# Patient Record
Sex: Female | Born: 1960 | Race: White | Hispanic: No | Smoking: Never smoker
Health system: Southern US, Community
[De-identification: ages and names within clinical notes are randomized; demographics above are authoritative.]

## PROBLEM LIST (undated history)

## (undated) DIAGNOSIS — I429 Cardiomyopathy, unspecified: Secondary | ICD-10-CM

## (undated) DIAGNOSIS — G43909 Migraine, unspecified, not intractable, without status migrainosus: Secondary | ICD-10-CM

## (undated) DIAGNOSIS — I421 Obstructive hypertrophic cardiomyopathy: Secondary | ICD-10-CM

## (undated) DIAGNOSIS — R42 Dizziness and giddiness: Secondary | ICD-10-CM

## (undated) HISTORY — DX: Cardiomyopathy, unspecified: I42.9

## (undated) HISTORY — DX: Migraine, unspecified, not intractable, without status migrainosus: G43.909

## (undated) HISTORY — DX: Obstructive hypertrophic cardiomyopathy: I42.1

## (undated) HISTORY — PX: APPENDECTOMY: SHX54

---

## 2014-08-03 ENCOUNTER — Ambulatory Visit: Payer: Self-pay

## 2014-08-03 ENCOUNTER — Ambulatory Visit (INDEPENDENT_AMBULATORY_CARE_PROVIDER_SITE_OTHER): Payer: Self-pay | Admitting: Family Medicine

## 2014-08-03 VITALS — BP 150/100 | HR 78 | Temp 98.2°F | Resp 16 | Ht 61.0 in | Wt 125.0 lb

## 2014-08-03 DIAGNOSIS — H109 Unspecified conjunctivitis: Secondary | ICD-10-CM

## 2014-08-03 DIAGNOSIS — L989 Disorder of the skin and subcutaneous tissue, unspecified: Secondary | ICD-10-CM

## 2014-08-03 DIAGNOSIS — K589 Irritable bowel syndrome without diarrhea: Secondary | ICD-10-CM

## 2014-08-03 DIAGNOSIS — I429 Cardiomyopathy, unspecified: Secondary | ICD-10-CM

## 2014-08-03 DIAGNOSIS — H00016 Hordeolum externum left eye, unspecified eyelid: Secondary | ICD-10-CM

## 2014-08-03 MED ORDER — TOBRAMYCIN 0.3 % OP SOLN: 1.0000 [drp] | Freq: Four times a day (QID) | OPHTHALMIC | Status: DC

## 2014-08-03 NOTE — Progress Notes (Signed)
This a 54 year old woman who several months ago moved from [place] to works for [employer]. She's changed over her insurance this is been a little bit confusing.  She's also had to change her for driver's license and was found to need corrective lenses. She developed a stye in the left eye which is persisted for the last couple months she's had intermittent crusting of her eyelids, most recently starting today.  In the past she's been treated with Tobrex which worked very well. She also [facility] which [facility]'t and is well with persistent film on her eyes.  She woke up this morning with crusting eyelids.  Patient also needs referral to gastroenterology (has had IBS and recent C. difficile), cardiology (patient has had a cardiomyopathy), dermatology (has had skin lesions that need checking), and ophthalmology (needs to have a hordeolum examined).  Objective: Spent about 20 minutes with the patient reviewing her many problems.  Eyes reveal small hordeolum in the mid upper left lid and mild conjunctivitis with lid margin crusting. EOM is normal. Pupils equal and reactive to light.       ICD-9-CM ICD-10-CM   1. Bilateral conjunctivitis 372.30 H10.9 tobramycin (TOBREX) 0.3 % ophthalmic solution     Ambulatory referral to Ophthalmology  2. 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 Skin lesion 709.9 L98.9 Ambulatory referral to Dermatology  5. IBS (irritable bowel syndrome) 564.1 K58.9 Ambulatory referral to Gastroenterology     Signed, Elvina SidleKurt Marisel Tostenson, MD

## 2014-08-03 NOTE — Patient Instructions (Signed)

## 2014-08-31 ENCOUNTER — Ambulatory Visit: Payer: Self-pay | Admitting: Family Medicine

## 2014-10-06 ENCOUNTER — Telehealth: Payer: Self-pay | Admitting: Family Medicine

## 2014-10-06 DIAGNOSIS — H00019 Hordeolum externum unspecified eye, unspecified eyelid: Secondary | ICD-10-CM

## 2014-10-06 DIAGNOSIS — I429 Cardiomyopathy, unspecified: Secondary | ICD-10-CM

## 2014-10-06 NOTE — Telephone Encounter (Signed)
Patient called and said "I need Dr. Milus GlazierLauenstein to give me a call. [phone]". And hung up..... Dr. Elbert EwingsL please handle this.  [phone]

## 2014-10-06 NOTE — Telephone Encounter (Signed)
Spoke with Erin Berry who states she is very unhappy with our referrals department.   1. She was referred to Dr Dione BoozeGroat who she says had a 2 hour wait time on her first visit, then she was scheduled for surgery today 10/06/14 and was told yet again there was a 2 hour wait time. She states she wants to go to another ophthalmologist who is "younger and whose hands smell like they have been washed".  2. Secondly, she complained about her not being scheduled with cardiologist yet, records show that the cardiologist attempted to contact patient on 09/01/14 and left a message for scheduling.   Patient was very upset and rude. She says if Dr L does not handle this she will find another PCP.

## 2014-10-10 NOTE — Telephone Encounter (Signed)
Please advise 

## 2014-10-10 NOTE — Telephone Encounter (Signed)
This is second referral to Dr. Hazle Quantdigby

## 2014-10-10 NOTE — Addendum Note (Signed)
Addended by: Elvina SidleLAUENSTEIN, Ruble Buttler on: 10/10/2014 05:03 PM   Modules accepted: Orders

## 2014-10-13 ENCOUNTER — Telehealth: Payer: Self-pay | Admitting: Cardiovascular Disease

## 2014-10-13 NOTE — Telephone Encounter (Signed)
Faxed release signed by patient to [facility] [phone] Lyman Bishop([place], [place]) for records for appointment with Dr Royann Shiversroitoru on 12/01/14. lp

## 2014-10-13 NOTE — Telephone Encounter (Signed)
Received records from [facility] for appointment with Dr Royann Shiversroitoru on 12/01/14.  [facility] for Dr Croitoru's schedule on 12/01/14. lp

## 2014-11-18 ENCOUNTER — Ambulatory Visit: Payer: Self-pay | Admitting: Cardiovascular Disease

## 2014-12-01 ENCOUNTER — Encounter: Payer: Self-pay | Admitting: Cardiovascular Disease

## 2014-12-01 ENCOUNTER — Ambulatory Visit (INDEPENDENT_AMBULATORY_CARE_PROVIDER_SITE_OTHER): Payer: No Typology Code available for payment source | Admitting: Cardiovascular Disease

## 2014-12-01 VITALS — BP 136/82 | HR 68 | Ht 62.0 in | Wt 128.5 lb

## 2014-12-01 DIAGNOSIS — I421 Obstructive hypertrophic cardiomyopathy: Secondary | ICD-10-CM | POA: Diagnosis not present

## 2014-12-01 DIAGNOSIS — I422 Other hypertrophic cardiomyopathy: Secondary | ICD-10-CM

## 2014-12-01 MED ORDER — FUROSEMIDE 20 MG PO TABS: 20.0000 mg | ORAL_TABLET | ORAL | Status: DC | PRN

## 2014-12-01 NOTE — Progress Notes (Signed)
Patient ID: Lorelee CoverCaroline Amason, female   DOB: 22-Oct-1960, 54 y.o.   MRN: [id number]     Cardiology Office Note   Date:  12/02/2014   ID:  Lorelee CoverCaroline Betke, DOB 22-Oct-1960, MRN [id number]  PCP:  Elvina SidleLAUENSTEIN,KURT, MD  Cardiologist:   Thurmon FairROITORU,Laetitia Schnepf, MD   Chief Complaint  Patient presents with  . New Evaluation    [place]. patient reports swelling-at 3 p she develops a crease in her ankles/feet, shortness of breath on exertion, soreness in her shins, lightheadedness/dizziness-when she stands too quickly.      History of Present Illness: Lorelee CoverCaroline Dimauro is a 54 y.o. female who presents for establishment of cardiology follow-up for hypertrophic obstructive cardiomyopathy after moving here from the [place] area (Dr. Earlie Ravelinghaganti). She had relatively late onset of symptoms around the age of 54. She developed chest pressure or shortness of breath at rest and with minimal exertion that improved drastically after beta blocker therapy was initiated.   Her echocardiogram from July 2014 shows a 71 mm peak gradient across the left ventricular outflow tract with asymmetric septal hypertrophy (septum 1.6 cm, posterior wall 1.2 cm) and borderline enlargement of the left atrium without any other meaningful cardiac abnormalities. Severe SAM was described, but there was only mild mitral insufficiency. She wore a Holter monitor that did not show any significant arrhythmia other than to PACs and whole 24-hour period. She does not have any PVCs or ventricular tachycardia. She underwent a cardiac MRI in August 2014 that showed findings compatible with hypertrophic cardiomyopathy. The septum was again described as moderately thickened at 15-17 millimeters. The ejection fraction was calculated at 59%. Platelet gadolinium diffuse enhancement with a intermediate pattern was seen throughout the left ventricle, consistent with hypertrophic cardiomyopathy.  She has never experienced syncope. There  is no family history of unexplained sudden or premature death, but the patient is adopted and family history is not known. Her daughter has been screened for hypertrophic cardiomyopathy and reportedly does not have the disorder.   She complains of constant fatigue since initiation of beta blocker therapy. She thinks she has had her thyroid checked and that was normal. He falls asleep very easily. She does not think that she snores. She worries about the fact that she has gained weight. She has never had a sleep study. He does score 14 points on the Epworth Sleepiness Scale. She complains of ankle edema which is sometimes so severe that she cannot put her shoes on. At times she has thought that she'll have to buy slippers to walk-in especially after long plane trip. He has been warned by her cardiologist in [place] that she should not take diuretics.  Her echocardiogram shows typical left atrial abnormality and left ventricular hypertrophy with prominent secondary changes in the repolarization.   Past Medical History  Diagnosis Date  . Cardiomyopathy   . Hypertrophic obstructive cardiomyopathy (HOCM) 12/02/2014    Past Surgical History  Procedure Laterality Date  . Appendectomy       Current Outpatient Prescriptions  Medication Sig Dispense Refill  . metoprolol tartrate (LOPRESSOR) 25 MG tablet Take 50 mg by mouth 3 (three) times daily.    . furosemide (LASIX) 20 MG tablet Take 1 tablet (20 mg total) by mouth as needed for fluid or edema (only). **Do not take more than 3 times weekly** 30 tablet 3   No current facility-administered medications for this visit.    Allergies:   Wellbutrin; Sulfur; and Penicillins    Social History:  The patient  reports that she has never smoked. She does not have any smokeless tobacco history on file.   Family History:  The patient's family history is not on file. She was adopted.    ROS:  Please see the history of present illness.    Otherwise,  review of systems positive for none.   All other systems are reviewed and negative.    PHYSICAL EXAM: VS:  BP 136/82 mmHg  Pulse 68  Ht  (1.575 m)  Wt 128 lb 8 oz (58.287 kg)  BMI 23.50 kg/m2 , BMI Body mass index is 23.5 kg/(m^2).  General: Alert, oriented x3, no distress Head: no evidence of trauma, PERRL, EOMI, no exophtalmos or lid lag, no myxedema, no xanthelasma; normal ears, nose and oropharynx Neck: normal jugular venous pulsations and no hepatojugular reflux; brisk carotid pulses without delay and no carotid bruits Chest: clear to auscultation, no signs of consolidation by percussion or palpation, normal fremitus, symmetrical and full respiratory excursions Cardiovascular: normal position and quality of the apical impulse, regular rhythm, normal first and second heart sounds, no diastolic murmurs, rubs or gallops. She has a typical midsystolic murmur in the aortic focus and at the left lower sternal border that increases with the Valsalva maneuver and decreases but is not abolished with bilateral hand grip Abdomen: no tenderness or distention, no masses by palpation, no abnormal pulsatility or arterial bruits, normal bowel sounds, no hepatosplenomegaly Extremities: no clubbing, cyanosi; 1-2+ symmetrical ankle edema; 2+ radial, ulnar and brachial pulses bilaterally; 2+ right femoral, posterior tibial and dorsalis pedis pulses; 2+ left femoral, posterior tibial and dorsalis pedis pulses; no subclavian or femoral bruits Neurological: grossly nonfocal Psych: euthymic mood, full affect   EKG:  EKG is ordered today. The ekg ordered today demonstrates NSR, LAA, LVH  Recent Labs: No results found for requested labs within last 365 days.    Lipid Panel No results found for: CHOL, TRIG, HDL, CHOLHDL, VLDL, LDLCALC, LDLDIRECT    Wt Readings from Last 3 Encounters:  12/01/14 128 lb 8 oz (58.287 kg)  08/03/14 125 lb (56.7 kg)      Other studies Reviewed: Additional studies/  records that were reviewed today include: Records from Dr. Earlie Raveling, Lyman [place], [place]   ASSESSMENT AND PLAN:  Mrs. Kendrick has hypertrophic obstructive cardiomyopathy that has had good symptomatic response to beta blocker therapy but is now troubled by persistent fatigue that could well be a beta blocker side effect. She does not have high risk factors for sudden cardiac death, although her family history is not known. She does not have excessive hypertrophy, personal history of syncope, any documentation of ventricular arrhythmia, etc. She also had very late-onset of the disorder, likely associated with good prognosis.  Recently she has been troubled by a lot of edema. She has some symptoms suggestive of possible obstructive sleep apnea. Will repeat an echocardiogram to assess the current LVOT gradient with and without provocative maneuvers and also to look for signs of right heart enlargement, pulmonary hypertension or other clues for obstructive sleep apnea. I suspect her fatigue is primarily related to beta blocker therapy.  We discussed the option for a mechanical intervention such as septal myectomy or alcohol septal ablation which will allow a reduction dose of beta blocker and possibly better quality of life.  While I reinforced the need to avoid dehydration which could definitely increased LV outflow tract gradient, I think judicious use of a low dose of a loop diuretic on an intermittent  basis may be useful for relief of lower extremity edema, and should be reasonably safe if used in moderation.  We need to consider a sleep study.  Current medicines are reviewed at length with the patient today.  The patient does not have concerns regarding medicines.  The following changes have been made:  Ursula 20 mg daily no more than 3 times weekly.  Labs/ tests ordered today include:  Orders Placed This Encounter  Procedures  . EKG 12-Lead  . Echocardiogram    Patient Instructions  Your  physician has requested that you have an echocardiogram. Echocardiography is a painless test that uses sound waves to create images of your heart. It provides your doctor with information about the size and shape of your heart and how well your heart's chambers and valves are working. This procedure takes approximately one hour. There are no restrictions for this procedure.  Dr. Royann Shiversroitoru recommends that you schedule a follow-up appointment in: 3 months.    Joie BimlerSigned, Quenna Doepke, MD  12/02/2014 4:00 PM    Thurmon FairMihai Ciarrah Rae, MD, [facility] [phone] office [phone] pager

## 2014-12-01 NOTE — Patient Instructions (Addendum)
Your physician has requested that you have an echocardiogram. Echocardiography is a painless test that uses sound waves to create images of your heart. It provides your doctor with information about the size and shape of your heart and how well your heart's chambers and valves are working. This procedure takes approximately one hour. There are no restrictions for this procedure.  Dr. Croitoru recommends that you schedule a follow-up appointment in: 3 months.   

## 2014-12-02 ENCOUNTER — Encounter: Payer: Self-pay | Admitting: Cardiovascular Disease

## 2014-12-02 DIAGNOSIS — I421 Obstructive hypertrophic cardiomyopathy: Secondary | ICD-10-CM

## 2014-12-02 HISTORY — DX: Obstructive hypertrophic cardiomyopathy: I42.1

## 2014-12-06 ENCOUNTER — Ambulatory Visit (HOSPITAL_COMMUNITY)
Admission: RE | Admit: 2014-12-06 | Discharge: 2014-12-06 | Disposition: A | Discharge status: Home / Self Care | Payer: No Typology Code available for payment source | Source: Ambulatory Visit | Attending: Cardiovascular Disease | Admitting: Cardiovascular Disease

## 2014-12-06 DIAGNOSIS — I422 Other hypertrophic cardiomyopathy: Secondary | ICD-10-CM | POA: Insufficient documentation

## 2014-12-09 ENCOUNTER — Telehealth: Payer: Self-pay | Admitting: Cardiovascular Disease

## 2014-12-09 NOTE — Telephone Encounter (Signed)
Pt waiting to hear back about the doctor who is going to do her procedure.She also have questions about dosage of her medicine.

## 2014-12-09 NOTE — Telephone Encounter (Signed)
I spoke with the physician at [facility] Doctor Arty BaumgartnerLowell Satler, who requested I upload her echo images, which has already been done. Please send copies of all the other records there also.

## 2014-12-09 NOTE — Telephone Encounter (Signed)
Returning your call. °

## 2014-12-09 NOTE — Telephone Encounter (Signed)
Spoke with Britta MccreedyBarbara - she also talked w/ patient, confirmed/reiterated correct dosing. Pt verbalized understanding w/instructions.

## 2014-12-09 NOTE — Telephone Encounter (Signed)
[facility] with [facility]s but refer [facility].  She also called [facility] and they are will to treat once they received all of her records.  Requested we fax all [facility] fax #[phone] Attn: Simon RheinCecily.

## 2014-12-09 NOTE — Telephone Encounter (Signed)
Called patient back, left voice mail instructing to call back. Note Dr. Erin Hearingroitoru's comment from result:  ----------------------------------------------------------------------------- Notes Recorded by Thurmon FairMihai Croitoru, MD on 12/07/2014 at 9:50 AM Echo confirms HOCM and still has substantial provocable gradient. I think she might do best with an alcohol septal ablation, which we do not offer here. Called her and suggested [facility], but since she recently moved here from [place] and still has family there, would prefer [facility]. Trying to contact Dr. Rockwell AlexandriaSatler there

## 2014-12-09 NOTE — Telephone Encounter (Signed)
Called this patient. Told her I was triage RN, Harrold Donathathan, calling on behalf of Dr. C to return her call, answer any questions. She was extremely pleasant on the phone at first. She understands we are waiting to hear from [facility].  We were discussing her metoprolol dosing. She asked what it said was prescribed for her to take. I reported that per last office note, she was instructed to continue her historical dose, 50mg  TID.  She insisted that Dr. Royann Shiversroitoru had adjusted the medication for her to take 1000mg  a day. I stated this was not correct, unsure where she got this information. After patient repeatedly asked me to tell her the dose, I did so every time.  She finally insisted that it wasn't correct, that she was told to take 1000mg  a day. She apparently attempted to do so and after taking 600mg  in one day, "felt extremely ill". I stated this was dangerous and inadvisable, and again, was unsure where the instruction was coming from. She got her AVS sheet, began reading over instructions. She acknowledged the dosing instructions for metoprolol as 50mg  TID. She did not find any changes on sheet - became hostile at this point. I stated again, as I had multiple times, that she should adhere to the 50mg  TID dose.  After this exchange, she asked me who I was. I reminded her I was an Nurse, learning disabilityN calling. She became extremely irate with me. Insisted that I didn't know what I was talking about. Demanded to speak to Dr. Royann Shiversroitoru. I informed her that he was not in the office today. Patient began yelling, stated "You will get him, call him and put him on the phone now." Informed her that her behavior was impolite and that I was hanging up the phone. I ended the call.

## 2014-12-09 NOTE — Telephone Encounter (Signed)
You are correct, Harrold Donathathan. I never gave her instructions to increase the metoprolol to 1000 mg a day. In fact our discussion was to continue the same dose of metoprolol she is currently on, because she complains of too much fatigue to increase the dose further.

## 2014-12-09 NOTE — Telephone Encounter (Signed)
Request sent to Lorita OfficerLynn Pruett in medical records F# [phone]  ATTN:   Simon RheinCecily

## 2014-12-12 ENCOUNTER — Telehealth: Payer: Self-pay | Admitting: Cardiovascular Disease

## 2014-12-12 NOTE — Telephone Encounter (Signed)
[facility] to [facility]

## 2014-12-12 NOTE — Telephone Encounter (Signed)
She wants to know if her records have been faxed to Dr Arty BaumgartnerLowell Satler at [facility]?

## 2014-12-13 NOTE — Telephone Encounter (Signed)
5.23.16 Spoke with Ms Erin Berry and advised we could send Dr Croitoru's office note/ekg to the Dr he referred her to in [place].  Will Fax

## 2014-12-14 ENCOUNTER — Telehealth: Payer: Self-pay | Admitting: *Deleted

## 2014-12-14 NOTE — Telephone Encounter (Signed)
-----   Message from Thurmon FairMihai Croitoru, MD sent at 12/14/2014  1:46 PM EDT ----- Please let her know that Dr. Arty BaumgartnerLowell Satler in [place] thinks she could be a good candidate for alcohol septal ablation. He is asking if it is OK for him to call and schedule her. If she agrees, please send him her demographic info.  [email or web address]

## 2014-12-14 NOTE — Telephone Encounter (Signed)
Dr. Herma MeringSatler's office has already contacted Mrs. Sonnenberg.  She will have a conference call with Dr. Rockwell AlexandriaSatler tomorrow 8PM and tenatively scheduled for the procedure 12/29/14 as long as they receive approval from her insurance company.

## 2014-12-14 NOTE — Telephone Encounter (Signed)
-----   Message from Mihai Croitoru, MD sent at 12/14/2014  1:46 PM EDT ----- Please let her know that Dr. Lowell Satler in [place], [place] thinks she could be a good candidate for alcohol septal ablation. He is asking if it is OK for him to call and schedule her. If she agrees, please send him her demographic info.  [email or web address] 

## 2014-12-26 ENCOUNTER — Telehealth: Payer: Self-pay | Admitting: Cardiovascular Disease

## 2014-12-26 MED ORDER — METOPROLOL TARTRATE 25 MG PO TABS: 50.0000 mg | ORAL_TABLET | Freq: Three times a day (TID) | ORAL | Status: DC

## 2014-12-26 NOTE — Telephone Encounter (Signed)
Rx(s) sent to pharmacy electronically. LM for patient that med was refilled 

## 2014-12-26 NOTE — Telephone Encounter (Signed)
°  1. Which medications need to be refilled? Metoprolol 25 mg 2 tab po qd tid  2. Which pharmacy is medication to be sent to?[facility] [address]   3. Do they need a 30 day or 90 day supply? 30  4. Would they like a call back once the medication has been sent to the pharmacy? yes

## 2015-01-13 ENCOUNTER — Encounter: Payer: Self-pay | Admitting: Cardiovascular Disease

## 2015-01-13 ENCOUNTER — Ambulatory Visit (INDEPENDENT_AMBULATORY_CARE_PROVIDER_SITE_OTHER): Payer: No Typology Code available for payment source | Admitting: Cardiovascular Disease

## 2015-01-13 VITALS — BP 122/86 | HR 80 | Ht 62.0 in | Wt 124.2 lb

## 2015-01-13 DIAGNOSIS — I421 Obstructive hypertrophic cardiomyopathy: Secondary | ICD-10-CM

## 2015-01-13 MED ORDER — VERAPAMIL HCL 40 MG PO TABS: 40.0000 mg | ORAL_TABLET | Freq: Three times a day (TID) | ORAL | Status: DC

## 2015-01-13 NOTE — Patient Instructions (Signed)
Medication Instructions:   START verapamil 40mg  by mouth - 1 tablet every 8 hours or 3x daily  Labwork:  NONE  Testing/Procedures:  NONE  Follow-Up:  1st available appointment with Dr Royann Shivers  Any Other Special Instructions Will Be Listed Below (If Applicable).

## 2015-01-13 NOTE — Progress Notes (Signed)
Patient ID: Erin Berry, female   DOB: 08-Nov-1960, 54 y.o.   MRN: [id number]      Cardiology Office Note   Date:  01/13/2015   ID:  Thayer Kimmins, DOB Jun 01, 1961, MRN [id number]  PCP:  Elvina Sidle, MD  Cardiologist:   Thurmon Fair, MD   Chief Complaint  Patient presents with  . Follow-up    had a procedure attempeted not performed alcohol septal ablasion; chest pain-has pressure, pain and tightness, frequently shortness of breath, has edema-fingers, ankles, midsection, has pain in legs, has cramping in legs, has lightheadedness,  has dizziness      History of Present Illness: Erin Berry is a 54 y.o. female who presents for  Follow-up after undergoing coronary angiography at [facility] with Dr. Rockwell Alexandria. 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 At cardiac catheterization, just as on echocardiography, she has high LV outflow tract gradients ( resting 60 mmHg, inducible 100 mmHg) despite being on a relatively high dose of beta blocker. She did not tolerate higher doses of metoprolol due to extreme fatigue.   She is back to discuss other options including septal myectomy or adjunctive pharmacological therapy. Dr. Gershon Mussel showed the available data to surgeons in [place] who declined to perform a septal myectomy. My understanding is that this was primarily related to low volume of this procedure at that center. The patient is hoping to secure an appointment at the [facility] to discuss surgical options in the middle of July.    She continues to describe significant symptoms. She becomes extremely short of breath climbing the steps to her condominium. She has actually paid for and will move into a single-story home. She asks pertinent questions regarding the purpose of pacemakers and defibrillators, her risk of sudden  death , her ability to exercise and overall prognosis.    Past Medical History  Diagnosis Date  . Cardiomyopathy   . Hypertrophic obstructive cardiomyopathy (HOCM) 12/02/2014    Past Surgical History  Procedure Laterality Date  . Appendectomy       Current Outpatient Prescriptions  Medication Sig Dispense Refill  . furosemide (LASIX) 20 MG tablet Take 1 tablet (20 mg total) by mouth as needed for fluid or edema (only). **Do not take more than 3 times weekly** 30 tablet 3  . metoprolol tartrate (LOPRESSOR) 25 MG tablet Take 2 tablets (50 mg total) by mouth 3 (three) times daily. 180 tablet 6  . verapamil (CALAN) 40 MG tablet Take 1 tablet (40 mg total) by mouth 3 (three) times daily. 90 tablet 6   No current facility-administered medications for this visit.    Allergies:   Wellbutrin; Morphine and related; Sulfur; and Penicillins    Social History:  The patient  reports that she has never smoked. She does not have any smokeless tobacco history on file.   Family History:  The patient's family history is not on file. She was adopted.    ROS:  Please see the history of present illness.    Otherwise, review of systems positive for none.   Specifically denies palpitations , syncope and exertional chest pain. She does have fullness in her chest if she climbs too many stairs and describes significant exertional dyspnea, NYHA functional class II. Ankle edema is helped by very low doses of loop diuretic dosed intermittently and compression stockings. All other systems are reviewed and  negative.    PHYSICAL EXAM: VS:  BP 122/86 mmHg  Pulse 80  Ht  (1.575 m)  Wt 124 lb 3.2 oz (56.337 kg)  BMI 22.71 kg/m2 , BMI Body mass index is 22.71 kg/(m^2).  General: Alert, oriented x3, no distress Head: no evidence of trauma, PERRL, EOMI, no exophtalmos or lid lag, no myxedema, no xanthelasma; normal ears, nose and oropharynx Neck: normal jugular venous pulsations and no hepatojugular  reflux; brisk carotid pulses without delay and no carotid bruits Chest: clear to auscultation, no signs of consolidation by percussion or palpation, normal fremitus, symmetrical and full respiratory excursions Cardiovascular: normal position and quality of the apical impulse, regular rhythm, normal first and second heart sounds, no diastolic murmurs, rubs or gallops. She has a typical midsystolic murmur in the aortic focus and at the left lower sternal border that increases with the Valsalva maneuver and decreases but is not abolished with bilateral hand grip Abdomen: no tenderness or distention, no masses by palpation, no abnormal pulsatility or arterial bruits, normal bowel sounds, no hepatosplenomegaly Extremities: no clubbing, cyanosi; 1-2+ symmetrical ankle edema; 2+ radial, ulnar and brachial pulses bilaterally; 2+ right femoral, posterior tibial and dorsalis pedis pulses; 2+ left femoral, posterior tibial and dorsalis pedis pulses; no subclavian or femoral bruits Neurological: grossly nonfocal Psych: euthymic mood, full affect   EKG:  EKG is not ordered today.  Recent Labs: No results found for requested labs within last 365 days.    Lipid Panel No results found for: CHOL, TRIG, HDL, CHOLHDL, VLDL, LDLCALC, LDLDIRECT    Wt Readings from Last 3 Encounters:  01/13/15 124 lb 3.2 oz (56.337 kg)  12/01/14 128 lb 8 oz (58.287 kg)  08/03/14 125 lb (56.7 kg)      Other studies Reviewed: Additional studies/ records that were reviewed today include:  Coronary angiography images and records of brief hospitalization in [place].  ASSESSMENT AND PLAN:  Despite beta blocker therapy in the highest tolerable dose, the patient continues to experience significant exertional symptoms that have at least moderate impact on her quality of life. She wishes to pursue more aggressive therapy. We'll start verapamil 40 mg 3 times daily. Plan to titrate this up as tolerated, if helpful we'll switch to the  sustained release formulation. Discussed disopyramide as an alternative option of potentially more side effects. She asked about pacemakers and review the fact that these are not indicated for treatment of hypertrophic cardiomyopathy, although they can be programmed as adjunctive therapy, if indicated for other reasons.   Encouraged her to pursue the evaluation at the [facility] for septal myectomy. I have also shown her data to one of our surgeons in town who thinks that septal myectomy is a reasonable option.  It'll be up to Erin Berry to decide whether she wants to travel out of town to receive care in a world class center  With more experienced in her disorder,such as the [facility] or if she prefers to have the support of family closer to home.  All these interventions are to provide better symptom control. As far as her risk for sudden cardiac death, thankfully she does not have any of the features associated with high risk for sudden death ( no syncope, no documented VT, absence of severe septal hypertrophy , family history is unknown since she is adopted). Her overall prognosis is fair in view of the relatively late onset of her symptoms , but I cannot promise would find a good solution for symptom relief.  We described the types of physical exercise that she should avoid in the intensity that she should reach. She should avoid any type of exercise the produces his anus, dyspnea or chest discomfort and should avoid intense bursts of isometric activities such as weight lifting , jogging or high-intensity aerobics.  Walking, very light jogging, swimming, biking are more appropriate with constant attention to avoid excessive exercise , heat, dehydration.   Current medicines are reviewed at length with the patient today.  The patient does not have concerns regarding medicines.  The following changes have been made:   Verapamil 40 mg every 8 hours  Labs/ tests ordered today include:  No orders of  the defined types were placed in this encounter.   Patient Instructions  Medication Instructions:   START verapamil  by mouth - 1 tablet every 8 hours or 3x daily  Labwork:  NONE  Testing/Procedures:  NONE  Follow-Up:  1st available appointment with Dr Royann Shivers  Any Other Special Instructions Will Be Listed Below (If Applicable).      Joie Bimler, MD  01/13/2015 11:23 AM    Thurmon Fair, MD, [facility] [phone] office [phone] pager

## 2015-01-16 ENCOUNTER — Telehealth: Payer: Self-pay | Admitting: Cardiovascular Disease

## 2015-01-16 NOTE — Telephone Encounter (Signed)
Pt called in stating that one of her medications did not get called in to the pharmacy last week but she isnt sure the name of it. Please f/u with the pt  Thanks

## 2015-01-16 NOTE — Telephone Encounter (Signed)
Patient notified Dr. Salena Saner wants to try verapamil first.  If this drug fails then he would try the second drug.  Patient voiced understanding and will call if she has any problems.

## 2015-01-16 NOTE — Telephone Encounter (Signed)
Spoke with patient . She states at the last office visit with Dr Royann Shivers had mention starting 2 new medications,but only one was called in "verapamil" She wanted to make sure if another medication is needed.  will defer to Dr Royann Shivers and contact patient.

## 2015-01-16 NOTE — Telephone Encounter (Signed)
The plan is to try verapamil first. The other drug is an alternative option, if verapamil does not work or is not tolerated.

## 2015-01-18 ENCOUNTER — Encounter: Payer: Self-pay | Admitting: Cardiovascular Disease

## 2015-02-17 ENCOUNTER — Ambulatory Visit (INDEPENDENT_AMBULATORY_CARE_PROVIDER_SITE_OTHER): Payer: No Typology Code available for payment source | Admitting: Family Medicine

## 2015-02-17 ENCOUNTER — Encounter: Payer: Self-pay | Admitting: Family Medicine

## 2015-02-17 VITALS — BP 131/77 | HR 72 | Temp 98.3°F | Resp 16 | Ht 62.0 in | Wt 124.6 lb

## 2015-02-17 DIAGNOSIS — F32A Depression, unspecified: Secondary | ICD-10-CM

## 2015-02-17 DIAGNOSIS — Z23 Encounter for immunization: Secondary | ICD-10-CM | POA: Diagnosis not present

## 2015-02-17 DIAGNOSIS — I421 Obstructive hypertrophic cardiomyopathy: Secondary | ICD-10-CM

## 2015-02-17 DIAGNOSIS — F418 Other specified anxiety disorders: Secondary | ICD-10-CM | POA: Diagnosis not present

## 2015-02-17 DIAGNOSIS — F419 Anxiety disorder, unspecified: Secondary | ICD-10-CM

## 2015-02-17 DIAGNOSIS — F329 Major depressive disorder, single episode, unspecified: Secondary | ICD-10-CM

## 2015-02-17 DIAGNOSIS — F430 Acute stress reaction: Secondary | ICD-10-CM | POA: Diagnosis not present

## 2015-02-17 NOTE — Progress Notes (Signed)
Subjective:    Patient ID: Erin Berry, female    DOB: Apr 12, 1961, 54 y.o.   MRN: [id number]  02/17/2015  Referral and Depression   HPI This 54 y.o. female presents for the following:  1.  HOCM: s/p evaluation by Dr. Royann Shivers; s/p echo and EKG.  Went for follow-up. Concerned with echo results.  Recommended more aggressive steps in treating cardiomyopathy HOCM.  Diagnosed in 2014.  Recommended alcohol septal ablation but no one in [place] performs it.  Only aware of one cardiologist at [facility] that performs it.  Will be out of work for 2-3 weeks for recovery.  Pt desires referral to cardiology in [place] area where family lives; referred to cardiologist in [facility].  Reviewed results of echo; scheduled alcohol septal ablation on 12/29/14.  Family present.  Performed EKG, high contrast echo.  Brought out of anesthesia; cardiologist unable to perform procedure.  Unable to successfully complete procedure.  Shocked that [place]/cardiologist was unable to complete procedure.  The artery and vein near septum are not near the septum to help heal the septum.  Dr. Gershon Mussel recommended evaluation at [facility].  Pt has a scheduled trip in upcoming months.  Recommended [facility]. Agreed to see pt on February 08, 2015.  Had three consultations at [facility] on 02/08/15; cardiology, research.  No charge for consultations.  Collected all records to [facility] clinic.  The [facility] physicians recommended septal myomectomy via open heart surgery.  [facility] does not recommend alcohol septal ablation.  Perfect surgical candidate; [facility] performs several per month.  Did receive a gap exception for [facility].  [employer] has not relationship with [facility].  Must look at policy and deductible; will determine need for a gap exception. Will require five day admission.  Every procedure must be submitted to [facility] and be approved with correct billing codes.   [employer] wants the providers want pt to undergo  surgery in [place].  In [place], no CVTS surgeon would accept the case.  Pt desires to go to a large cardiology group who performs surgery daily and not someone in [place] who performs two per year.  Local cardiologist only looked within practice to perform surgery.   [name], MD Chair, Cardiovascular diseases [address] [place], [place] [address] [phone]  Taking Metoprolol 25mg  two tid.  Causes horrible diarrhea.  Must take five imodium every morning. Also taking Verapamil 40mg  tid.     2.  Depression:  Failed depression screening.  Suffers with SOB; unable to tolerate heat due to SOB.  Denies SI/HI.     Last physical: years ago Pap smear:  Years ago; appointment scheduled Mammogram:  2014 Colonoscopy:  2015 TDAP:  Years ago. Pneumovax:  never Influenza:  never Eye exam: Dental exam:  Review of Systems  Past Medical History  Diagnosis Date  . Cardiomyopathy   . Hypertrophic obstructive cardiomyopathy (HOCM) 12/02/2014   Past Surgical History  Procedure Laterality Date  . Appendectomy    . Cesarean section     Allergies  Allergen Reactions  . Wellbutrin [Bupropion] Anaphylaxis  . Morphine And Related Other (See Comments)    Inside and out of body felt like it was on fire  . Sulfur Hives  . Penicillins Hives   Current Outpatient Prescriptions  Medication Sig Dispense Refill  . diphenhydrAMINE (BENADRYL) 25 MG tablet Take 25 mg by mouth every 6 (six) hours as needed.    . furosemide (LASIX) 20 MG tablet  Take 1 tablet (20 mg total) by mouth as needed for fluid or edema (only). **Do not take more than 3 times weekly** 30 tablet 3  . metoprolol tartrate (LOPRESSOR) 25 MG tablet Take 2 tablets (50 mg total) by mouth 3 (three) times daily. 180 tablet 6  . verapamil (CALAN) 40 MG tablet Take 1 tablet (40 mg total) by mouth 3 (three) times daily. 90 tablet 6   No current facility-administered medications for this visit.       Objective:    BP 131/77  mmHg  Pulse 72  Temp(Src) 98.3 F (36.8 C) (Oral)  Resp 16  Ht  (1.575 m)  Wt 124 lb 9.6 oz (56.518 kg)  BMI 22.78 kg/m2  SpO2 95% Physical Exam No results found for this or any previous visit.     Assessment & Plan:   Hypertrophic obstructive cardiomyopathy (HOCM) - Plan: Ambulatory referral to Cardiology, Ambulatory referral to Cardiothoracic Surgery  Anxiety and depression  Acute stress reaction    Meds ordered this encounter  Medications  . diphenhydrAMINE (BENADRYL) 25 MG tablet    Sig: Take 25 mg by mouth every 6 (six) hours as needed.    No Follow-up on file.    Kacee Sukhu Paulita Fujita, M.D. [facility] [address] [place], [place]  [address] [phone] phone [phone] fax

## 2015-03-02 ENCOUNTER — Telehealth: Payer: Self-pay | Admitting: Cardiovascular Disease

## 2015-03-02 NOTE — Telephone Encounter (Signed)
°  Mrs Capurro is calling to ask for a stronger diuretic , the one she is taking now is not strong enough. Please Call

## 2015-03-02 NOTE — Telephone Encounter (Signed)
Called patient LVMTCB.

## 2015-03-02 NOTE — Telephone Encounter (Signed)
Patient is taking 20 mg 2 times a week and she is  Drinking all this fluid and it is not getting out of her body.  She does not weigh herself daily  She knows that she is retaining fluid because by 1pm in the afternoon  Her ring is off and she can't button her pants.    Her ankles becomes very swollen up to her knees.  She has an appointment with Dr. Royann Shivers on [address].  She also wanted me to let him know that she is having a Tax inspector  At [facility] in [place]

## 2015-03-03 NOTE — Telephone Encounter (Signed)
Please tell her she can take 40 mg (220 ) at a time. Try to take no more than 2-3 times a week

## 2015-03-03 NOTE — Telephone Encounter (Signed)
Lm for patient to call back on Monday.

## 2015-03-06 ENCOUNTER — Ambulatory Visit (INDEPENDENT_AMBULATORY_CARE_PROVIDER_SITE_OTHER): Payer: No Typology Code available for payment source | Admitting: Cardiovascular Disease

## 2015-03-06 VITALS — BP 110/70 | HR 70 | Resp 16 | Ht 62.0 in | Wt 125.0 lb

## 2015-03-06 DIAGNOSIS — I421 Obstructive hypertrophic cardiomyopathy: Secondary | ICD-10-CM

## 2015-03-06 DIAGNOSIS — I5033 Acute on chronic diastolic (congestive) heart failure: Secondary | ICD-10-CM

## 2015-03-06 MED ORDER — FUROSEMIDE 20 MG PO TABS: 20.0000 mg | ORAL_TABLET | Freq: Every day | ORAL | Status: DC | PRN

## 2015-03-06 NOTE — Progress Notes (Signed)
Patient ID: Sulay Brymer, female   DOB: [birth year], 54 y.o.   MRN: [id number]     Cardiology Office Note   Date:  03/07/2015   ID:  Zoha Spranger, DOB January 14, 1961, MRN [id number]  PCP:  Elvina Sidle, MD  Cardiologist:   Thurmon Fair, MD   Chief Complaint  Patient presents with  . Follow-up    has had chest pain since the last vist  . Shortness of Breath    since last time  . Edema    feet fingers and mid section      History of Present Illness: Evadna Donaghy is a 54 y.o. female who presents for her sending edema in the setting of hypertrophic obstructive cardiomyopathy. She is scheduled to undergo a septal myectomy on November 30 with Dr. York Spaniel at the [facility]. Exertional dyspnea and dizziness have persisted despite treatment with highest tolerated dose of beta blocker and verapamil. She complains of extreme fatigue from the beta blocker and constant sleepiness. At cardiac catheterization, just as on echocardiography, she has high LV outflow tract gradients ( resting 60 mmHg, inducible 100 mmHg) despite being on a relatively high dose of beta blocker.  Over the last week she has had worsening lower extremity edema and did not seem to have as good of a response from furosemide as in the past. Suddenly over the weekend she had improved diuresis and at the time of appointment today ankle edema has resolved. It was severe enough to be extremely uncomfortable and she trouble getting her shoes on.  She also reports extreme shortness of breath during her flight from [place] to [place] several weeks ago.  She has been drinking 6 bottles of Propel water with electrolytes-it has an extremely high sodium content (230 mg, 10% of daily recommended amount per bottle). She was unaware about the very large amount of sodium that she has been ingesting.  She denies palpitations or near syncope/syncope.   Past Medical History  Diagnosis Date  . Cardiomyopathy   . Hypertrophic  obstructive cardiomyopathy (HOCM) 12/02/2014    Past Surgical History  Procedure Laterality Date  . Appendectomy    . Cesarean section       Current Outpatient Prescriptions  Medication Sig Dispense Refill  . diphenhydrAMINE (BENADRYL) 25 MG tablet Take 25 mg by mouth every 6 (six) hours as needed.    . furosemide (LASIX) 20 MG tablet Take 1 tablet (20 mg total) by mouth daily as needed for fluid or edema (only). 30 tablet 6  . metoprolol tartrate (LOPRESSOR) 25 MG tablet Take 2 tablets (50 mg total) by mouth 3 (three) times daily. 180 tablet 6  . verapamil (CALAN) 40 MG tablet Take 1 tablet (40 mg total) by mouth 3 (three) times daily. 90 tablet 6   No current facility-administered medications for this visit.    Allergies:   Wellbutrin; Morphine and related; Sulfur; and Penicillins    Social History:  The patient  reports that she has never smoked. She does not have any smokeless tobacco history on file.   Family History:  The patient's family history is not known. She was adopted.    ROS:  Please see the history of present illness.    Otherwise, review of systems positive for headaches.   All other systems are reviewed and negative.    PHYSICAL EXAM: VS:  BP 110/70 mmHg  Pulse 70  Ht  (1.575 m)  Wt 125 lb (56.7 kg)  BMI 22.86 kg/m2 , BMI Body mass  index is 22.86 kg/(m^2).  General: Alert, oriented x3, no distress Head: no evidence of trauma, PERRL, EOMI, no exophtalmos or lid lag, no myxedema, no xanthelasma; normal ears, nose and oropharynx Neck: normal jugular venous pulsations and no hepatojugular reflux; brisk carotid pulses without delay and no carotid bruits Chest: clear to auscultation, no signs of consolidation by percussion or palpation, normal fremitus, symmetrical and full respiratory excursions Cardiovascular: normal position and quality of the apical impulse, regular rhythm, normal first and second heart sounds, no rubs or gallops, grade 2/6 harsh  systolic ejection murmur at the left lower sternal border and aortic focus does not radiate to the carotids, worsens following the Valsalva maneuver and diminishes with bilateral handgrip Abdomen: no tenderness or distention, no masses by palpation, no abnormal pulsatility or arterial bruits, normal bowel sounds, no hepatosplenomegaly Extremities: no clubbing, cyanosis or edema; 2+ radial, ulnar and brachial pulses bilaterally; 2+ right femoral, posterior tibial and dorsalis pedis pulses; 2+ left femoral, posterior tibial and dorsalis pedis pulses; no subclavian or femoral bruits Neurological: grossly nonfocal Psych: euthymic mood, full affect   EKG:  EKG is not ordered today.   Wt Readings from Last 3 Encounters:  03/06/15 125 lb (56.7 kg)  02/17/15 124 lb 9.6 oz (56.518 kg)  01/13/15 124 lb 3.2 oz (56.337 kg)     ASSESSMENT AND PLAN:  1. Diastolic heart failure with acute worsening secondary to excessive sodium intake. Stop drinking the sodium enriched water. Continue current diuretic regimen. Avoid excessive diuresis since this may lead to presyncope or syncope.  2. Hypertrophic obstructive cardiomyopathy with persistent gradient despite combination beta blocker and calcium channel blocker treatment. Felt not to be a good candidate for alcohol septal ablation due to coronary anatomy, now scheduled for septal myectomy in November at the [facility] in [place]. It is anticipated she will spent 5 days in [place] in the hospital an additional couple of days in recovery there. Subsequently she plans to go to [place] to live with her family. She may require oxygen supplementation for the return flight to [place].  Current medicines are reviewed at length with the patient today.  The patient does not have concerns regarding medicines.  The following changes have been made:  no change  Labs/ tests ordered today include:  No orders of the defined types were placed in this  encounter.     Patient Instructions  A NEW RX HAS BEEN SENT TO YOUR PHARMACY FOR FUROSEMIDE INSTRUCTING YOU TO TAKE DAILY AS NEEDED, AS INSTRUCTED.  Dr. Royann Shivers recommends that you schedule a follow-up appointment in: EARLY January 2017.    Joie Bimler, MD  03/07/2015 2:05 PM    Thurmon Fair, MD, [facility] [phone] office [phone] pager

## 2015-03-06 NOTE — Patient Instructions (Signed)
A NEW RX HAS BEEN SENT TO YOUR PHARMACY FOR FUROSEMIDE INSTRUCTING YOU TO TAKE DAILY AS NEEDED, AS INSTRUCTED.  Dr. Royann Shivers recommends that you schedule a follow-up appointment in: EARLY January 2017.

## 2015-03-06 NOTE — Telephone Encounter (Signed)
Discussed with patient at today's office visit

## 2015-03-07 ENCOUNTER — Encounter: Payer: Self-pay | Admitting: Cardiovascular Disease

## 2015-03-07 DIAGNOSIS — I5032 Chronic diastolic (congestive) heart failure: Secondary | ICD-10-CM

## 2015-03-07 DIAGNOSIS — I421 Obstructive hypertrophic cardiomyopathy: Secondary | ICD-10-CM | POA: Insufficient documentation

## 2015-03-13 ENCOUNTER — Ambulatory Visit (INDEPENDENT_AMBULATORY_CARE_PROVIDER_SITE_OTHER): Payer: No Typology Code available for payment source | Admitting: Internal Medicine

## 2015-03-13 VITALS — BP 122/80 | HR 73 | Temp 97.3°F | Resp 16 | Ht 61.0 in | Wt 125.8 lb

## 2015-03-13 DIAGNOSIS — H8110 Benign paroxysmal vertigo, unspecified ear: Secondary | ICD-10-CM | POA: Insufficient documentation

## 2015-03-13 DIAGNOSIS — H8111 Benign paroxysmal vertigo, right ear: Secondary | ICD-10-CM

## 2015-03-13 DIAGNOSIS — I421 Obstructive hypertrophic cardiomyopathy: Secondary | ICD-10-CM

## 2015-03-13 MED ORDER — MECLIZINE HCL 25 MG PO TABS: 25.0000 mg | ORAL_TABLET | Freq: Three times a day (TID) | ORAL | Status: DC | PRN

## 2015-03-13 NOTE — Patient Instructions (Signed)
Please take the meclizine up to three times daily as needed.  Be sure to go to the ER asap if the vertigo comes on and does not resolve or you feel weakness on one side of your body.   Epley Maneuver Self-Care WHAT IS THE EPLEY MANEUVER? The Epley maneuver is an exercise you can do to relieve symptoms of benign paroxysmal positional vertigo (BPPV). This condition is often just referred to as vertigo. BPPV is caused by the movement of tiny crystals (canaliths) inside your inner ear. The accumulation and movement of canaliths in your inner ear causes a sudden spinning sensation (vertigo) when you move your head to certain positions. Vertigo usually lasts about 30 seconds. BPPV usually occurs in just one ear. If you get vertigo when you lie on your left side, you probably have BPPV in your left ear. Your health care provider can tell you which ear is involved.  BPPV may be caused by a head injury. Many people older than 50 get BPPV for unknown reasons. If you have been diagnosed with BPPV, your health care provider may teach you how to do this maneuver. BPPV is not life threatening (benign) and usually goes away in time.  WHEN SHOULD I PERFORM THE EPLEY MANEUVER? You can do this maneuver at home whenever you have symptoms of vertigo. You may do the Epley maneuver up to 3 times a day until your symptoms of vertigo go away. HOW SHOULD I DO THE EPLEY MANEUVER? 1. Sit on the edge of a bed or table with your back straight. Your legs should be extended or hanging over the edge of the bed or table.  2. Turn your head halfway toward the affected ear.  3. Lie backward quickly with your head turned until you are lying flat on your back. You may want to position a pillow under your shoulders.  4. Hold this position for 30 seconds. You may experience an attack of vertigo. This is normal. Hold this position until the vertigo stops. 5. Then turn your head to the opposite direction until your unaffected ear is facing  the floor.  6. Hold this position for 30 seconds. You may experience an attack of vertigo. This is normal. Hold this position until the vertigo stops. 7. Now turn your whole body to the same side as your head. Hold for another 30 seconds.  8. You can then sit back up. ARE THERE RISKS TO THIS MANEUVER? In some cases, you may have other symptoms (such as changes in your vision, weakness, or numbness). If you have these symptoms, stop doing the maneuver and call your health care provider. Even if doing these maneuvers relieves your vertigo, you may still have dizziness. Dizziness is the sensation of light-headedness but without the sensation of movement. Even though the Epley maneuver may relieve your vertigo, it is possible that your symptoms will return within 5 years. WHAT SHOULD I DO AFTER THIS MANEUVER? After doing the Epley maneuver, you can return to your normal activities. Ask your doctor if there is anything you should do at home to prevent vertigo. This may include:  Sleeping with two or more pillows to keep your head elevated.  Not sleeping on the side of your affected ear.  Getting up slowly from bed.  Avoiding sudden movements during the day.  Avoiding extreme head movement, like looking up or bending over.  Wearing a cervical collar to prevent sudden head movements. WHAT SHOULD I DO IF MY SYMPTOMS GET WORSE? Call  your health care provider if your vertigo gets worse. Call your provider right way if you have other symptoms, including:   Nausea.  Vomiting.  Headache.  Weakness.  Numbness.  Vision changes. Document Released: 07/13/2013 Document Reviewed: 07/13/2013 [facility] Patient Information 2015 [place], [place]. This information is not intended to replace advice given to you by your health care provider. Make sure you discuss any questions you have with your health care provider.   Benign Positional Vertigo Vertigo means you feel like you or your surroundings are  moving when they are not. Benign positional vertigo is the most common form of vertigo. Benign means that the cause of your condition is not serious. Benign positional vertigo is more common in older adults. CAUSES  Benign positional vertigo is the result of an upset in the labyrinth system. This is an area in the middle ear that helps control your balance. This may be caused by a viral infection, head injury, or repetitive motion. However, often no specific cause is found. SYMPTOMS  Symptoms of benign positional vertigo occur when you move your head or eyes in different directions. Some of the symptoms may include: 9. Loss of balance and falls. 10. Vomiting. 11. Blurred vision. 12. Dizziness. 13. Nausea. 14. Involuntary eye movements (nystagmus). DIAGNOSIS  Benign positional vertigo is usually diagnosed by physical exam. If the specific cause of your benign positional vertigo is unknown, your caregiver may perform imaging tests, such as magnetic resonance imaging (MRI) or computed tomography (CT). TREATMENT  Your caregiver may recommend movements or procedures to correct the benign positional vertigo. Medicines such as meclizine, benzodiazepines, and medicines for nausea may be used to treat your symptoms. In rare cases, if your symptoms are caused by certain conditions that affect the inner ear, you may need surgery. HOME CARE INSTRUCTIONS   Follow your caregiver's instructions.  Move slowly. Do not make sudden body or head movements.  Avoid driving.  Avoid operating heavy machinery.  Avoid performing any tasks that would be dangerous to you or others during a vertigo episode.  Drink enough fluids to keep your urine clear or pale yellow. SEEK IMMEDIATE MEDICAL CARE IF:   You develop problems with walking, weakness, numbness, or using your arms, hands, or legs.  You have difficulty speaking.  You develop severe headaches.  Your nausea or vomiting continues or gets worse.  You  develop visual changes.  Your family or friends notice any behavioral changes.  Your condition gets worse.  You have a fever.  You develop a stiff neck or sensitivity to light. MAKE SURE YOU:   Understand these instructions.  Will watch your condition.  Will get help right away if you are not doing well or get worse. Document Released: 04/15/2006 Document Revised: 09/30/2011 Document Reviewed: 03/28/2011 [facility] Patient Information 2015 [place], [place]. This information is not intended to replace advice given to you by your health care provider. Make sure you discuss any questions you have with your health care provider.

## 2015-03-13 NOTE — Progress Notes (Signed)
   Subjective:    Patient ID: Erin Berry, female    DOB: 1961-05-08, 54 y.o.   MRN: [id number]  Chief Complaint  Patient presents with  . Headache    x 4 days   . balance    pt. concerned    Medications, allergies, past medical history, surgical history, family history, social history and problem list reviewed and updated.  HPI  54 yof presents with HA and balance issues.   Sleeping 4 night ago and awoke with sensation of room spinning. Lasted most of night. Unsteady gait most of next morning. Eventually recovered and next night while lying down with head to right had similar sx. Intermittent 5-6 times throughout the night past 3 nights. Only with head turned to right. Feels her balance has been slightly off each morning but gets better throughout day. Right side of head hurts when she has each vertigo episode. Vision in right eye somewhat blurry when has the episodes.   Bilateral hand tingling past week. Denies unilateral weakness, numbness. Denies double vision.   Has hx HCOM with diastolic hf, will be undergoing septal myectomy later this year.   Review of Systems No fevers, chills, palps, cp, sob.     Objective:   Physical Exam  Constitutional: She is oriented to person, place, and time. She appears well-developed and well-nourished.  Non-toxic appearance. She does not have a sickly appearance. She does not appear ill. No distress.  BP 122/80 mmHg  Pulse 73  Temp(Src) 97.3 F (36.3 C) (Oral)  Resp 16  Ht  (1.549 m)  Wt 125 lb 12.8 oz (57.063 kg)  BMI 23.78 kg/m2  SpO2 98%   HENT:  Right Ear: Tympanic membrane normal.  Left Ear: Tympanic membrane normal.  Eyes: Conjunctivae and EOM are normal. Pupils are equal, round, and reactive to light.  Neck: Normal range of motion. Carotid bruit is not present. No Brudzinski's sign noted.  Neurological: She is oriented to person, place, and time. She has normal strength. No cranial nerve deficit or sensory deficit.    Negative rapid alternating movements. Positive dix halpike testing to right. Negative to left.   Psychiatric: She has a normal mood and affect. Her speech is normal and behavior is normal.      Assessment & Plan:   Benign paroxysmal positional vertigo, right - Plan: meclizine (ANTIVERT) 25 MG tablet  Hypertrophic obstructive cardiomyopathy (HOCM)  BPPV (benign paroxysmal positional vertigo), right --doubt cva/tia as sx are intermittent, positional, and no vascular rfs along with normal neuro exam today --likely bppv with positional sx reproducible with dix halpike --meclizine, positional maneuvers reviewed --rtc if vertigo persists, new onset weakness  Donnajean Lopes, PA-C Physician Assistant-Certified [facility]  03/13/2015 7:00 PM  I have participated in the care of this patient with the Advanced Practice Provider and agree with Diagnosis and Plan as documented. Robert P. Merla Riches, M.D.

## 2015-04-13 ENCOUNTER — Encounter (HOSPITAL_COMMUNITY): Payer: Self-pay | Admitting: *Deleted

## 2015-04-13 ENCOUNTER — Emergency Department (HOSPITAL_COMMUNITY): Payer: No Typology Code available for payment source

## 2015-04-13 ENCOUNTER — Inpatient Hospital Stay (HOSPITAL_COMMUNITY)
Admission: EM | Admit: 2015-04-13 | Discharge: 2015-04-15 | DRG: 421 | Disposition: A | Discharge status: Home / Self Care | Payer: No Typology Code available for payment source | Attending: Family Medicine | Admitting: Family Medicine

## 2015-04-13 ENCOUNTER — Inpatient Hospital Stay (HOSPITAL_COMMUNITY): Payer: No Typology Code available for payment source

## 2015-04-13 DIAGNOSIS — Z79899 Other long term (current) drug therapy: Secondary | ICD-10-CM | POA: Diagnosis not present

## 2015-04-13 DIAGNOSIS — K760 Fatty (change of) liver, not elsewhere classified: Secondary | ICD-10-CM | POA: Diagnosis present

## 2015-04-13 DIAGNOSIS — I5032 Chronic diastolic (congestive) heart failure: Secondary | ICD-10-CM | POA: Diagnosis present

## 2015-04-13 DIAGNOSIS — I5033 Acute on chronic diastolic (congestive) heart failure: Secondary | ICD-10-CM

## 2015-04-13 DIAGNOSIS — Z888 Allergy status to other drugs, medicaments and biological substances status: Secondary | ICD-10-CM

## 2015-04-13 DIAGNOSIS — K831 Obstruction of bile duct: Secondary | ICD-10-CM | POA: Insufficient documentation

## 2015-04-13 DIAGNOSIS — Z882 Allergy status to sulfonamides status: Secondary | ICD-10-CM

## 2015-04-13 DIAGNOSIS — K858 Other acute pancreatitis: Secondary | ICD-10-CM

## 2015-04-13 DIAGNOSIS — E876 Hypokalemia: Secondary | ICD-10-CM | POA: Diagnosis present

## 2015-04-13 DIAGNOSIS — I421 Obstructive hypertrophic cardiomyopathy: Secondary | ICD-10-CM | POA: Diagnosis present

## 2015-04-13 DIAGNOSIS — Z880 Allergy status to penicillin: Secondary | ICD-10-CM

## 2015-04-13 DIAGNOSIS — Z885 Allergy status to narcotic agent status: Secondary | ICD-10-CM | POA: Diagnosis not present

## 2015-04-13 DIAGNOSIS — K851 Biliary acute pancreatitis without necrosis or infection: Secondary | ICD-10-CM

## 2015-04-13 DIAGNOSIS — K859 Acute pancreatitis without necrosis or infection, unspecified: Secondary | ICD-10-CM | POA: Diagnosis present

## 2015-04-13 DIAGNOSIS — K7689 Other specified diseases of liver: Secondary | ICD-10-CM | POA: Diagnosis present

## 2015-04-13 DIAGNOSIS — R1011 Right upper quadrant pain: Secondary | ICD-10-CM

## 2015-04-13 DIAGNOSIS — R109 Unspecified abdominal pain: Secondary | ICD-10-CM | POA: Diagnosis not present

## 2015-04-13 HISTORY — DX: Dizziness and giddiness: R42

## 2015-04-13 LAB — COMPREHENSIVE METABOLIC PANEL
ALT: 74 U/L — AB (ref 14–54)
AST: 159 U/L — AB (ref 15–41)
Albumin: 4.1 g/dL (ref 3.5–5.0)
Alkaline Phosphatase: 186 U/L — ABNORMAL HIGH (ref 38–126)
Anion gap: 16 — ABNORMAL HIGH (ref 5–15)
BILIRUBIN TOTAL: 1.9 mg/dL — AB (ref 0.3–1.2)
BUN: <5 mg/dL — ABNORMAL LOW (ref 6–20)
CALCIUM: 9.2 mg/dL (ref 8.9–10.3)
CO2: 23 mmol/L (ref 22–32)
CREATININE: 0.57 mg/dL (ref 0.44–1.00)
Chloride: 95 mmol/L — ABNORMAL LOW (ref 101–111)
Glucose, Bld: 116 mg/dL — ABNORMAL HIGH (ref 65–99)
Potassium: 3.3 mmol/L — ABNORMAL LOW (ref 3.5–5.1)
Sodium: 134 mmol/L — ABNORMAL LOW (ref 135–145)
TOTAL PROTEIN: 7 g/dL (ref 6.5–8.1)

## 2015-04-13 LAB — CBC WITH DIFFERENTIAL/PLATELET
Basophils Absolute: 0 10*3/uL (ref 0.0–0.1)
Basophils Relative: 0 %
EOS PCT: 0 %
Eosinophils Absolute: 0 10*3/uL (ref 0.0–0.7)
HEMATOCRIT: 44.7 % (ref 36.0–46.0)
HEMOGLOBIN: 15.4 g/dL — AB (ref 12.0–15.0)
LYMPHS ABS: 2.1 10*3/uL (ref 0.7–4.0)
LYMPHS PCT: 23 %
MCH: 38.8 pg — ABNORMAL HIGH (ref 26.0–34.0)
MCHC: 34.5 g/dL (ref 30.0–36.0)
MCV: 112.6 fL — AB (ref 78.0–100.0)
MONOS PCT: 7 %
Monocytes Absolute: 0.6 10*3/uL (ref 0.1–1.0)
NEUTROS ABS: 6.3 10*3/uL (ref 1.7–7.7)
Neutrophils Relative %: 70 %
Platelets: 263 10*3/uL (ref 150–400)
RBC: 3.97 MIL/uL (ref 3.87–5.11)
RDW: 14.9 % (ref 11.5–15.5)
WBC: 9 10*3/uL (ref 4.0–10.5)

## 2015-04-13 LAB — LIPID PANEL
CHOLESTEROL: 218 mg/dL — AB (ref 0–200)
LDL Cholesterol: UNDETERMINED mg/dL (ref 0–99)
Triglycerides: 1254 mg/dL — ABNORMAL HIGH (ref <150)
VLDL: UNDETERMINED mg/dL (ref 0–40)

## 2015-04-13 LAB — BRAIN NATRIURETIC PEPTIDE: B Natriuretic Peptide: 103.7 pg/mL — ABNORMAL HIGH (ref 0.0–100.0)

## 2015-04-13 LAB — I-STAT TROPONIN, ED: Troponin i, poc: 0.01 ng/mL (ref 0.00–0.08)

## 2015-04-13 LAB — LIPASE, BLOOD: LIPASE: 265 U/L — AB (ref 22–51)

## 2015-04-13 LAB — TRIGLYCERIDES: Triglycerides: 175 mg/dL — ABNORMAL HIGH (ref <150)

## 2015-04-13 LAB — LACTATE DEHYDROGENASE: LDH: 327 U/L — AB (ref 98–192)

## 2015-04-13 MED ORDER — ONDANSETRON HCL 4 MG/2ML IJ SOLN
4.0000 mg | Freq: Four times a day (QID) | INTRAMUSCULAR | Status: DC | PRN
  Administered 2015-04-13: 4 mg via INTRAVENOUS
  Filled 2015-04-13: qty 2

## 2015-04-13 MED ORDER — IOHEXOL 300 MG/ML  SOLN
100.0000 mL | Freq: Once | INTRAMUSCULAR | Status: AC | PRN
  Administered 2015-04-13: 100 mL via INTRAVENOUS

## 2015-04-13 MED ORDER — INSULIN REGULAR HUMAN 100 UNIT/ML IJ SOLN
INTRAMUSCULAR | Status: DC
  Filled 2015-04-13: qty 2.5

## 2015-04-13 MED ORDER — ONDANSETRON HCL 4 MG/2ML IJ SOLN
4.0000 mg | Freq: Once | INTRAMUSCULAR | Status: AC
  Administered 2015-04-13: 4 mg via INTRAVENOUS
  Filled 2015-04-13: qty 2

## 2015-04-13 MED ORDER — DIPHENHYDRAMINE HCL 25 MG PO CAPS
25.0000 mg | ORAL_CAPSULE | Freq: Once | ORAL | Status: DC
  Filled 2015-04-13: qty 1

## 2015-04-13 MED ORDER — HYDROMORPHONE HCL 1 MG/ML IJ SOLN
1.0000 mg | Freq: Once | INTRAMUSCULAR | Status: AC
  Administered 2015-04-13: 1 mg via INTRAVENOUS
  Filled 2015-04-13: qty 1

## 2015-04-13 MED ORDER — SODIUM CHLORIDE 0.9 % IV SOLN
INTRAVENOUS | Status: DC
  Administered 2015-04-13: 16:00:00 via INTRAVENOUS

## 2015-04-13 MED ORDER — POTASSIUM CHLORIDE CRYS ER 20 MEQ PO TBCR
40.0000 meq | EXTENDED_RELEASE_TABLET | Freq: Once | ORAL | Status: AC
  Administered 2015-04-13: 40 meq via ORAL
  Filled 2015-04-13: qty 2

## 2015-04-13 MED ORDER — VERAPAMIL HCL 40 MG PO TABS
40.0000 mg | ORAL_TABLET | Freq: Once | ORAL | Status: AC
  Administered 2015-04-13: 40 mg via ORAL
  Filled 2015-04-13 (×2): qty 1

## 2015-04-13 MED ORDER — HYDROMORPHONE HCL 1 MG/ML IJ SOLN
1.0000 mg | INTRAMUSCULAR | Status: DC | PRN
  Administered 2015-04-13 – 2015-04-14 (×6): 1 mg via INTRAVENOUS
  Filled 2015-04-13 (×6): qty 1

## 2015-04-13 MED ORDER — DOCUSATE SODIUM 100 MG PO CAPS: 100.0000 mg | ORAL_CAPSULE | Freq: Two times a day (BID) | ORAL | Status: DC | PRN

## 2015-04-13 MED ORDER — ACETAMINOPHEN 650 MG RE SUPP: 650.0000 mg | Freq: Four times a day (QID) | RECTAL | Status: DC | PRN

## 2015-04-13 MED ORDER — ACETAMINOPHEN 325 MG PO TABS: 650.0000 mg | ORAL_TABLET | Freq: Four times a day (QID) | ORAL | Status: DC | PRN

## 2015-04-13 MED ORDER — SODIUM CHLORIDE 0.9 % IJ SOLN
3.0000 mL | Freq: Two times a day (BID) | INTRAMUSCULAR | Status: DC
  Administered 2015-04-13 – 2015-04-14 (×2): 3 mL via INTRAVENOUS

## 2015-04-13 MED ORDER — DIPHENHYDRAMINE HCL 25 MG PO TABS: 50.0000 mg | ORAL_TABLET | Freq: Four times a day (QID) | ORAL | Status: DC | PRN

## 2015-04-13 MED ORDER — METOPROLOL TARTRATE 50 MG PO TABS
50.0000 mg | ORAL_TABLET | Freq: Three times a day (TID) | ORAL | Status: DC
  Administered 2015-04-13 – 2015-04-14 (×4): 50 mg via ORAL
  Filled 2015-04-13 (×4): qty 1

## 2015-04-13 MED ORDER — HYDROMORPHONE HCL 1 MG/ML IJ SOLN
1.0000 mg | Freq: Once | INTRAMUSCULAR | Status: AC
Start: 2015-04-13 — End: 2015-04-13
  Administered 2015-04-13: 1 mg via INTRAVENOUS
  Filled 2015-04-13: qty 1

## 2015-04-13 MED ORDER — DIPHENHYDRAMINE HCL 25 MG PO CAPS
25.0000 mg | ORAL_CAPSULE | Freq: Once | ORAL | Status: AC
  Administered 2015-04-13: 25 mg via ORAL
  Filled 2015-04-13: qty 1

## 2015-04-13 MED ORDER — ONDANSETRON HCL 4 MG PO TABS: 4.0000 mg | ORAL_TABLET | Freq: Four times a day (QID) | ORAL | Status: DC | PRN

## 2015-04-13 MED ORDER — GEMFIBROZIL 600 MG PO TABS: 600.0000 mg | ORAL_TABLET | Freq: Two times a day (BID) | ORAL | Status: DC

## 2015-04-13 MED ORDER — ENOXAPARIN SODIUM 40 MG/0.4ML ~~LOC~~ SOLN
40.0000 mg | SUBCUTANEOUS | Status: DC
  Administered 2015-04-13 – 2015-04-14 (×2): 40 mg via SUBCUTANEOUS
  Filled 2015-04-13 (×2): qty 0.4

## 2015-04-13 MED ORDER — VERAPAMIL HCL 40 MG PO TABS
40.0000 mg | ORAL_TABLET | Freq: Three times a day (TID) | ORAL | Status: DC
Start: 2015-04-13 — End: 2015-04-15
  Administered 2015-04-13 – 2015-04-14 (×4): 40 mg via ORAL
  Filled 2015-04-13 (×9): qty 1

## 2015-04-13 MED ORDER — DEXTROSE-NACL 5-0.45 % IV SOLN
INTRAVENOUS | Status: DC
  Administered 2015-04-13: 20:00:00 via INTRAVENOUS
  Administered 2015-04-14: 100 mL/h via INTRAVENOUS

## 2015-04-13 MED ORDER — METOPROLOL TARTRATE 25 MG PO TABS
50.0000 mg | ORAL_TABLET | Freq: Once | ORAL | Status: AC
  Administered 2015-04-13: 50 mg via ORAL
  Filled 2015-04-13: qty 2

## 2015-04-13 MED ORDER — DIPHENHYDRAMINE HCL 25 MG PO TABS
25.0000 mg | ORAL_TABLET | Freq: Four times a day (QID) | ORAL | Status: DC | PRN
  Administered 2015-04-13: 25 mg via ORAL
  Filled 2015-04-13 (×3): qty 1

## 2015-04-13 NOTE — Progress Notes (Signed)
Utilization review completed. Cartina Brousseau, RN, BSN. 

## 2015-04-13 NOTE — ED Provider Notes (Signed)
Patient signed out to me at shift change by Piepenbrink, PA-C.  Patient began having abdominal pain yesterday after eating.  Pain is in upper abdomen.  Prior abdominal surgeries include appendectomy.  No signs of SBO.  Tolerating orals and having BMs.  Lipase is 265.  LFTs are elevated.  CT pending.  CT remarkable for pancreatitis and extrahepatic biliary dilatation and possibly CBD filling defect.  Concern for choledocholithiasis and obstruction.  Will check RUQ [place] to check for gallstones.  US unremarkable for gallstones, but there is persistent extrahepatic biliary dilatation.  Possibility of recently passed stone vs edema at the ampulla.    Patient remarks that she has had some abdominal distention and with her HOCM has had problems with fluid retention.  Patient still has moderate to severe abdominal pain.  She still has some nausea, but states that she is thirsty.  Roxy Horseman, PA-C 04/13/15 1119  Gerhard Munch, MD 04/14/15 [phone]

## 2015-04-13 NOTE — ED Notes (Signed)
Pt c/o abdominal pain onset tonight. Reports having difficulty lying flat due to pressure of abdomen pushing into chest; hx of cardiomyopathy with surgery scheduled in November. Pt also c/o NVD

## 2015-04-13 NOTE — ED Provider Notes (Signed)
CSN: [id number]     Arrival date & time 04/13/15  4098 History   First MD Initiated Contact with Patient 04/13/15 [phone]     Chief Complaint  Patient presents with  . Abdominal Pain     (Consider location/radiation/quality/duration/timing/severity/associated sxs/prior Treatment) HPI Comments: Patient is a 54 yo F PMHx significant for HOCM presenting to the ED for evaluation of generalized abdominal pain with distention. Patient states she went out to eat, had only a few bites when she felt full then a few hours later developed numerous episodes of nonbloody nonbilious emesis and diarrhea. She states when she lies flat she feels the pressure abdomen pushing into her chest making it hard to breathe. No modifying factors identified. Abdominal surgical history includes cesarean section. Followed by Dr. Trena Platt of cardiology. hx of cardiomyopathy with surgery scheduled in November.   Past Medical History  Diagnosis Date  . Cardiomyopathy   . Hypertrophic obstructive cardiomyopathy (HOCM) 12/02/2014  . Vertigo    Past Surgical History  Procedure Laterality Date  . Appendectomy    . Cesarean section     Family History  Problem Relation Age of Onset  . Adopted: Yes   Social History  Substance Use Topics  . Smoking status: Never Smoker   . Smokeless tobacco: None  . Alcohol Use: 0.0 oz/week    0 Standard drinks or equivalent per week   OB History    No data available     Review of Systems  Gastrointestinal: Positive for nausea, vomiting, abdominal pain, diarrhea and abdominal distention.  All other systems reviewed and are negative.     Allergies  Wellbutrin; Morphine and related; Sulfur; and Penicillins  Home Medications   Prior to Admission medications   Medication Sig Start Date End Date Taking? Authorizing Provider  metoprolol tartrate (LOPRESSOR) 25 MG tablet Take 2 tablets (50 mg total) by mouth 3 (three) times daily. 12/26/14  Yes Mihai Croitoru, MD  verapamil (CALAN)  40 MG tablet Take 1 tablet (40 mg total) by mouth 3 (three) times daily. 01/13/15  Yes Mihai Croitoru, MD  diphenhydrAMINE (BENADRYL) 25 MG tablet Take 25 mg by mouth every 6 (six) hours as needed.    Historical Provider, MD  furosemide (LASIX) 20 MG tablet Take 1 tablet (20 mg total) by mouth daily as needed for fluid or edema (only). 03/06/15   Mihai Croitoru, MD  meclizine (ANTIVERT) 25 MG tablet Take 1 tablet (25 mg total) by mouth 3 (three) times daily as needed for dizziness. 03/13/15   Todd McVeigh, PA   BP 130/78 mmHg  Pulse 68  Temp(Src) 98.6 F (37 C) (Oral)  Resp 19  Ht  (1.575 m)  Wt 125 lb (56.7 kg)  BMI 22.86 kg/m2  SpO2 95% Physical Exam  Constitutional: She is oriented to person, place, and time. She appears well-developed and well-nourished.  HENT:  Head: Normocephalic and atraumatic.  Right Ear: External ear normal.  Left Ear: External ear normal.  Nose: Nose normal.  Mouth/Throat: No oropharyngeal exudate.  Eyes: Conjunctivae are normal.  Neck: Neck supple.  Cardiovascular: Normal rate and regular rhythm.   Murmur heard. Pulmonary/Chest: Effort normal and breath sounds normal.  Abdominal: Soft. Bowel sounds are normal. She exhibits distension. There is tenderness (generalized). There is guarding. There is no rebound.  Neurological: She is alert and oriented to person, place, and time.  Skin: Skin is warm and dry.  Nursing note and vitals reviewed.   ED Course  Procedures (including critical care  time) Medications  ondansetron (ZOFRAN) injection 4 mg (4 mg Intravenous Given 04/13/15 0507)  HYDROmorphone (DILAUDID) injection 1 mg (1 mg Intravenous Given 04/13/15 0507)    Labs Review Labs Reviewed  COMPREHENSIVE METABOLIC PANEL - Abnormal; Notable for the following:    Sodium 134 (*)    Potassium 3.3 (*)    Chloride 95 (*)    Glucose, Bld 116 (*)    BUN <5 (*)    AST 159 (*)    ALT 74 (*)    Alkaline Phosphatase 186 (*)    Total Bilirubin 1.9 (*)     Anion gap 16 (*)    All other components within normal limits  LIPASE, BLOOD - Abnormal; Notable for the following:    Lipase 265 (*)    All other components within normal limits  CBC WITH DIFFERENTIAL/PLATELET - Abnormal; Notable for the following:    Hemoglobin 15.4 (*)    MCV 112.6 (*)    MCH 38.8 (*)    All other components within normal limits  URINALYSIS, ROUTINE W REFLEX MICROSCOPIC (NOT AT [facility])  I-STAT TROPOININ, ED    Imaging Review No results found. I have personally reviewed and evaluated these images and lab results as part of my medical decision-making.   EKG Interpretation   Date/Time:  Thursday April 13 2015 04:57:07 EDT Ventricular Rate:  71 PR Interval:  166 QRS Duration: 80 QT Interval:  421 QTC Calculation: 457 R Axis:   78 Text Interpretation:  Sinus rhythm Biatrial enlargement LVH with secondary  repolarization abnormality Baseline wander in lead(s) V1 V3 Sinus rhythm T  wave abnormality Left ventricular hypertrophy Abnormal ekg Confirmed by  Gerhard Munch  MD ([id number]) on 04/13/2015 5:00:55 AM      MDM   Final diagnoses:  Abdominal pain in female    Filed Vitals:   04/13/15 0530  BP: 130/78  Pulse: 68  Temp:   Resp: 19   Afebrile, NAD, non-toxic appearing, AAOx4.   Patient presenting with nausea, vomiting, diarrhea, abdominal pain and distention. On examination abdomen is soft, distended, with generalized tenderness and guarding. Will place IV and given pain medications and anti-emetics. Will obtain labs, UA, and CT abd/pelv for further evaluation of abdominal pain and distention. CT scan pending at time of shift change. Signed out to [employer], PA-C. Patient d/w with Dr. Jeraldine Loots, agrees with plan.      Francee Piccolo, PA-C 04/13/15 0604  Gerhard Munch, MD 04/14/15 [phone]

## 2015-04-13 NOTE — ED Provider Notes (Signed)
Patient signed out to me at shift change by Piepenbrink, PA-C.  Patient began having abdominal pain yesterday after eating. Pain is in upper abdomen. Prior abdominal surgeries include appendectomy. No signs of SBO. Tolerating orals and having BMs.  Lipase is 265. LFTs are elevated.  CT pending.  CT remarkable for pancreatitis and extrahepatic biliary dilatation and possibly CBD filling defect. Concern for choledocholithiasis and obstruction. Will check RUQ [place] to check for gallstones.  US unremarkable for gallstones, but there is persistent extrahepatic biliary dilatation. Possibility of recently passed stone vs edema at the ampulla.  Patient remarks that she has had some abdominal distention and with her HOCM has had problems with fluid retention.  Patient still has moderate to severe abdominal pain. She still has some nausea, but states that she is thirsty.  Roxy Horseman, PA-C  04/13/15 1119   1:11 PM IT glitch.  Possible duplicate note.  Roxy Horseman, PA-C 04/13/15 1311  Gerhard Munch, MD 04/14/15 [phone]

## 2015-04-13 NOTE — Progress Notes (Signed)
Family Medicine Teaching Service Interim Progress Note   Triglyceride result indicates this as likely cause of pancreatitis. Will need triglyceride-lowering therapy, insulin gtt with data for acute treatment. Will require higher nursing monitoring but no SDU beds available per patient placement. UpToDate also recommends ICU monitoring, though this patient is very hemodynamically stable without evidence of lung injury. Discussed case with Elink Dr. Arsenio Loader for ICU transfer. CCM Demarlo Riojas to assess pt.   Hepatic steatosis also identified on imaging. Will check Hb A1c.   Ryan B. Jarvis Newcomer, MD, PGY-2 04/13/2015 4:10 PM

## 2015-04-13 NOTE — ED Notes (Signed)
Pt to ED c/o generalized abdominal pain onset tonight associated with NVD. Tenderness to RUQ and LLQ

## 2015-04-13 NOTE — Consult Note (Signed)
Reason for Consult: Gallstone pancreatitis Referring Physician: Family Medicine  Erin Berry HPI: This is a 54 year old female admitted with complaints of acute upper abdominal pain, nausea, and vomiting.  Further work up revealed that she had elevation in her lipase and a CT scan confirmed an acute pancreatitis.  There was also dilation of the extrahepatic ducts and the suspicion of a distal CBD stone.  No stone was noted in the gallbladder and there is no evidence of an acute cholecystitis.  Past Medical History  Diagnosis Date  . Cardiomyopathy   . Hypertrophic obstructive cardiomyopathy (HOCM) 12/02/2014  . Vertigo     Past Surgical History  Procedure Laterality Date  . Appendectomy    . Cesarean section      Family History  Problem Relation Age of Onset  . Adopted: Yes    Social History:  reports that she has never smoked. She does not have any smokeless tobacco history on file. She reports that she drinks alcohol. She reports that she does not use illicit drugs.  Allergies:  Allergies  Allergen Reactions  . Wellbutrin [Bupropion] Anaphylaxis  . Morphine And Related Other (See Comments)    Inside and out of body felt like it was on fire  . Sulfur Hives  . Penicillins Hives    Medications:  Scheduled: . enoxaparin (LOVENOX) injection  40 mg Subcutaneous Q24H  . metoprolol tartrate  50 mg Oral TID  . sodium chloride  3 mL Intravenous Q12H  . verapamil  40 mg Oral TID   Continuous: . sodium chloride 100 mL/hr at 04/13/15 1600    Results for orders placed or performed during the hospital encounter of 04/13/15 (from the past 24 hour(s))  Comprehensive metabolic panel     Status: Abnormal   Collection Time: 04/13/15  5:00 AM  Result Value Ref Range   Sodium 134 (L) 135 - 145 mmol/L   Potassium 3.3 (L) 3.5 - 5.1 mmol/L   Chloride 95 (L) 101 - 111 mmol/L   CO2 23 22 - 32 mmol/L   Glucose, Bld 116 (H) 65 - 99 mg/dL   BUN <5 (L) 6 - 20 mg/dL   Creatinine, Ser  0.45 0.44 - 1.00 mg/dL   Calcium 9.2 8.9 - 40.9 mg/dL   Total Protein 7.0 6.5 - 8.1 g/dL   Albumin 4.1 3.5 - 5.0 g/dL   AST 811 (H) 15 - 41 U/L   ALT 74 (H) 14 - 54 U/L   Alkaline Phosphatase 186 (H) 38 - 126 U/L   Total Bilirubin 1.9 (H) 0.3 - 1.2 mg/dL   GFR calc non Af Amer >60 >60 mL/min   GFR calc Af Amer >60 >60 mL/min   Anion gap 16 (H) 5 - 15  Lipase, blood     Status: Abnormal   Collection Time: 04/13/15  5:00 AM  Result Value Ref Range   Lipase 265 (H) 22 - 51 U/L  CBC with Differential     Status: Abnormal   Collection Time: 04/13/15  5:00 AM  Result Value Ref Range   WBC 9.0 4.0 - 10.5 K/uL   RBC 3.97 3.87 - 5.11 MIL/uL   Hemoglobin 15.4 (H) 12.0 - 15.0 g/dL   HCT 91.4 78.2 - 95.6 %   MCV 112.6 (H) 78.0 - 100.0 fL   MCH 38.8 (H) 26.0 - 34.0 pg   MCHC 34.5 30.0 - 36.0 g/dL   RDW 21.3 08.6 - 57.8 %   Platelets 263 150 - 400  K/uL   Neutrophils Relative % 70 %   Lymphocytes Relative 23 %   Monocytes Relative 7 %   Eosinophils Relative 0 %   Basophils Relative 0 %   Neutro Abs 6.3 1.7 - 7.7 K/uL   Lymphs Abs 2.1 0.7 - 4.0 K/uL   Monocytes Absolute 0.6 0.1 - 1.0 K/uL   Eosinophils Absolute 0.0 0.0 - 0.7 K/uL   Basophils Absolute 0.0 0.0 - 0.1 K/uL   RBC Morphology STOMATOCYTES   Lactate dehydrogenase     Status: Abnormal   Collection Time: 04/13/15  5:00 AM  Result Value Ref Range   LDH 327 (H) 98 - 192 U/L  Brain natriuretic peptide     Status: Abnormal   Collection Time: 04/13/15  5:00 AM  Result Value Ref Range   B Natriuretic Peptide 103.7 (H) 0.0 - 100.0 pg/mL  Lipid panel     Status: Abnormal   Collection Time: 04/13/15  5:00 AM  Result Value Ref Range   Cholesterol 218 (H) 0 - 200 mg/dL   Triglycerides 4540 (H) <150 mg/dL   HDL NOT REPORTED DUE TO HIGH TRIGLYCERIDES >40 mg/dL   Total CHOL/HDL Ratio NOT REPORTED DUE TO HIGH TRIGLYCERIDES RATIO   VLDL UNABLE TO CALCULATE IF TRIGLYCERIDE OVER 400 mg/dL 0 - 40 mg/dL   LDL Cholesterol UNABLE TO CALCULATE  IF TRIGLYCERIDE OVER 400 mg/dL 0 - 99 mg/dL  I-stat troponin, ED     Status: None   Collection Time: 04/13/15  5:10 AM  Result Value Ref Range   Troponin i, poc 0.01 0.00 - 0.08 ng/mL   Comment 3             Ct Abdomen Pelvis W Contrast  04/13/2015   CLINICAL DATA:  Generalized abdominal pain. Nausea and vomiting. Upper abdominal tenderness.  EXAM: CT ABDOMEN AND PELVIS WITH CONTRAST  TECHNIQUE: Multidetector CT imaging of the abdomen and pelvis was performed using the standard protocol following bolus administration of intravenous contrast.  CONTRAST:  OMNIPAQUE IOHEXOL 300 MG/ML  SOLN  COMPARISON:  None.  FINDINGS: Lower chest:  Unremarkable  Hepatobiliary: Extensive and heterogeneous hepatic steatosis. Hypodense 8 mm focal lesion in the lateral segment left hepatic lobe, image 21 series 2. A second hypodense lesion in the lateral segment left hepatic lobe on image 31 series 2 measures 1.7 by 1.3 cm. There are some other focal hypodensities which are most likely a manifestation of focally more severe hepatic steatosis in the liver. The steatosis is somewhat heterogeneous and speckled in appearance.  Mildly accentuated mucosal thickening of the gallbladder. Abnormally dilated extrahepatic biliary system with common bile duct at 10 mm. Possible filling defect in the distal common bile duct on image 33 of series 5, cannot exclude CBD stone. Cholangitis is also not excluded.  Pancreas: Peripancreatic stranding in the vicinity of the pancreatic head is specially. The dorsal pancreatic duct does not appear dilated.  Spleen: Unremarkable  Adrenals/Urinary Tract: Unremarkable  Stomach/Bowel: Abnormal inflammatory stranding adjacent to the descending duodenum and proximal third portion of the duodenum with some associated mucosal enhancement. Nondistended segment of descending colon also demonstrates mucosal enhancement and potentially mild wall thickening. Appendix absent.  Vascular/Lymphatic: Small  retroperitoneal and porta hepatis lymph nodes are likely reactive. Minimal aortoiliac atherosclerosis.  Reproductive: New multiple uterine fibroids, largest approximately 3 cm along the left posterior uterine body. No endometrial thickening.  Other: Mesenteric edema especially around the pancreatic head, with a small amount of fluid tracking along the right paracolic  gutter. Small rim calcified nodule along the inferior portion of the right paracolic gutter on image 63 series 2, likely a residua from remote inflammation.  Musculoskeletal: Unremarkable  IMPRESSION: 1. Abnormal extrahepatic biliary dilatation possibly with a distal CBD filling defect, concerning for choledocholithiasis and obstruction. There is some accentuated mucosal enhancement in the gallbladder. Inflammatory stranding around the pancreatic head and descending duodenum may be from secondary inflammation; there is no dilatation of the dorsal pancreatic duct. Pancreatitis mainly involving the pancreatic head would be a differential diagnostic consideration. The possibility of a small mass in the vicinity of the ampulla is not totally excluded. Consider right upper quadrant sonography to assess for gallstones. 2. Heterogeneous hypodensity throughout the liver but especially along the gallbladder fossa and in segment 8. There is thought to be underlying diffuse hepatic steatosis and this may well represent heterogeneity of steatosis. No portal or hepatic vein thrombosis is identified to suggest a vascular occlusive cause for this heterogeneity. Cholangitis is a possibility but the extrahepatic bile duct wall does not appear to significantly abnormally enhance. Hepatitis is not entirely excluded. 3. Several hypodense lesions in the liver are likely cysts. 4. Multiple uterine fibroids. 5. Mesenteric edema, especially around the pancreatic head region.   Electronically Signed   By: Gaylyn Rong M.D.   On: 04/13/2015 07:29   US Abdomen  Limited  04/13/2015   CLINICAL DATA:  Right upper quadrant abdominal pain for 1 day. Initial encounter.  EXAM: US ABDOMEN LIMITED - RIGHT UPPER QUADRANT  COMPARISON:  CT 04/13/2015.  FINDINGS: Gallbladder:  No gallstones or wall thickening visualized. No sonographic Murphy sign noted.  Common bile duct:  Diameter: 12 mm.  No intraductal calculi visualized.  Liver:  The liver is heterogeneously echogenic, corresponding with severe steatosis on CT. Two simple cysts are noted, measuring up to 1.7 cm in diameter. No focally suspicious lesion.  Pancreas not evaluated by this examination.  IMPRESSION: 1. Extrahepatic biliary dilatation without demonstrated common duct stone. Given the apparent findings of acute pancreatitis on CT, these findings may be secondary to a recently passed stone or edema at the ampulla. If there is persistent concern of bilirubin obstruction, follow-up MRCP may be helpful. 2. No evidence of gallstones or cholecystitis. 3. Heterogeneous hepatic steatosis with simple hepatic cysts.   Electronically Signed   By: Carey Bullocks M.D.   On: 04/13/2015 10:05   Dg Chest Port 1 View  04/13/2015   CLINICAL DATA:  Generalized abdominal pain and distention. Emesis. Dyspnea.  EXAM: PORTABLE CHEST 1 VIEW  COMPARISON:  04/13/2015  FINDINGS: The heart size and mediastinal contours are within normal limits. Both lungs are clear. The visualized skeletal structures are unremarkable.  IMPRESSION: No active disease.   Electronically Signed   By: Gaylyn Rong M.D.   On: 04/13/2015 12:23    ROS:  As stated above in the HPI otherwise negative.  Blood pressure 173/71, pulse 69, temperature 98.6 F (37 C), temperature source Oral, resp. rate 20, height  (1.575 m), weight 56.7 kg (125 lb), SpO2 99 %.    PE: Gen: NAD, Alert and Oriented HEENT:  [place]/AT, EOMI Neck: Supple, no LAD Lungs: CTA Bilaterally CV: RRR without M/G/R ABM: Soft, tender in the epigastric region Ext: No  C/C/E  Assessment/Plan: 1) Dilated CBD. 2) Elevated liver enzymes. 3) Acute pancreatitis - ? Gallstone origin.  Plan: 1) ERCP tomorrow.  HUNG,PATRICK D 04/13/2015, 2:44 PM

## 2015-04-13 NOTE — H&P (Signed)
[facility] Admission History and Physical Service Pager: [phone]  Patient name: Erin Berry Medical record number: [id number] Date of birth: [birth year] Age: 54 y.o. Gender: female  Primary Care Provider: Robyn Haber, MD Consultants: GI, General Surgery (curbside) Code Status: Full (confirmed on admission)  Chief Complaint: Upper Acute Abdominal Pain, Nausea / Vomiting  Assessment and Plan: Erin Berry is a 54 y.o. female presenting with acute upper abdominal pain, nausea, vomiting, found to have elevated lipase and confirmed acute pancreatitis on CT/US, suspected trigger with pancreatic duct edema vs passed gallstone, with extrahepatic biliary duct distention. PMH is significant for HOCM, Chronic Diastolic CHF (Grd 2), BPPV  # Acute Pancreatitis, with dilated extrahepatic bilary duct without identified gallstones Clinically consistent with acute pancreatitis with elevated lipase 265, upper abd pain, confirmed on imaging with Abd CT, unclear exact etiology, suspect may be pancreatic duct edema (from chronic dCHF with some abd / mesenteric edema) vs passed gallstone (however none identified in gallbladder or in duct on RUQ Abd [place]). No alcohol consumption recently. Additionally with dilated extrahepatic biliary duct on imaging, not clinically consistent with cholangitis or cholecystitis (negative on imaging), concern with elevated alk phos and T.Bili 1.9, suggestive of biliary etiology (but not clearly identified). Possible triglycerides (pending). Ranson Score 1 on admit (0.9% mortality), APACHE-II score 4 (not considered severe), currently on admit with stable VS (hypertensive BP, otherwise afebrile, resp stable). Additionally with e - Admit to telemetry (d/t HOCM), inpatient status, attending Dr. McDiarmid - Vitals per protocol - NPO (+sips and meds), ADAT - IVF rehydration with NS @ 100cc/hr (lower IVF rate due to lower BMI and concern with h/o dCHF,  caution fluid overload) - Pain Control - Dilaudid IV 21m q 3 hr PRN severe pain, Zofran PRN nausea, Benadryl PRN (itching with dilaudid) - Called General Surgery to discuss case for potential consult and recommendations. After reviewing images and labs, suggested that GI consult would be more appropriate for possible MRCP. Surgery advises that HIDA would not be useful in this setting as it could only identify biliary dyskinesia and possible contribution to her pain, but would not further determine etiology of potential stone. They were concerned however with elevated T.Bili 1.9, and will discuss further with on-call attending and will contact [place] plan to formally consult. - Consulted GI (greatly appreciate recommendations and assistance), plan to see patient now and advised that may benefit from MRCP to further investigate potential stone etiology  # Hypertrophic Obstructive Cardiomyopathy Chronic known HOCM x 2-3 years. Followed by Cardiology (Dr. CSallyanne Kuster, currently on medical therapy with BB and CCB, s/p attempted prior septal ablation procedure, future plan for septal myomectomy at outside hospital in 05/2015. - Telemetry - Continue home Metoprolol 575mPO TID - Continue home Verapamil 4057mO TID  # Chronic Diastolic CHF (Grade 2 Diastolic Dysfxn), 2/2 HOCM Secondary to HOCM. Currently no acute evidence of volume overload, does have notable abd distention and mesenteric edema on imaging. No lower ext edema, no JVD. Only on PRN Lasix. - Check BNP on admit - Check CXR portable in ED - no evidence of overload - Strict intake output - Daily wts - NPO as above - Cautious with IVF NS @ 100cc/hr for now  FEN/GI: NS @ 100cc/hr while NPO with sips, meds Prophylaxis: Lovenox SQ  Disposition: Admit to inpatient for acute pancreatitis for IV analgesia and rehydration, consulted GI for possible MRCP, anticipate 1-3 day hospitalization pending medical improvement.  History of Present Illness:   [name]  is a 54 y.o. female presenting with acute abdominal pain, nausea, vomiting.  Reported that she started feeling "off" 2 days ago, feeling general mild abdominal pain and some nausea. She is used to carrying edema and "fluid" chronically around ankles and abdomen, due to her known Chronic HOCM. Described symptoms with significant worsening within past 24 hours with inc severity of upper abdominal pain across L to R abd not radiating to back or chest, initially severe 10/10, now improved to 7/10 with dilaudid in ED, nausea, vomiting last night and early morning has not had any PO today. Also admitted to difficulty sleeping, also stated that due to swelling in abdomen it was "pushing on her lungs" making it difficult to breath. Went to [facility] approx 0300 today 9/22. - No prior history of pancreatitis. No h/o gallstones. No h/o cholecystitis. S/p appendectomy. No new medication changes. No recent increased alcohol consumption. - Admits some chronic diarrhea / loose stools, last BM yesterday - Denies any fevers/chills, HA, confusion, lightheadedness, pre-syncope/syncope, chest pain, SOB, constipation  Followed by Cardiology, diagnosed HOCM 2-3 years ago. Detected heart murmur on routine physical, EKG and referred to Cardiology. Patient is adopted, but she does have a daughter, who had an ECHO which was normal. Currently followed by Dr. Sallyanne Kuster, has been on medical therapy with metoprolol and verapamil, had been referred for surgery with failed Alcohol septal ablation in [place], now awaiting upcoming next surgery with septal Myectomy Nov 30 in [place]: Per HPI with the following additions: none Otherwise 12 point review of systems was performed and was unremarkable.  Patient Active Problem List   Diagnosis Date Noted  . Pancreatitis 04/13/2015  . BPPV (benign paroxysmal positional vertigo) 03/13/2015  . Acute on chronic diastolic congestive heart  failure 03/07/2015  . Hypertrophic obstructive cardiomyopathy (HOCM) 12/02/2014   Past Medical History: Past Medical History  Diagnosis Date  . Cardiomyopathy   . Hypertrophic obstructive cardiomyopathy (HOCM) 12/02/2014  . Vertigo    Past Surgical History: Past Surgical History  Procedure Laterality Date  . Appendectomy    . Cesarean section     Social History: Social History  Substance Use Topics  . Smoking status: Never Smoker   . Smokeless tobacco: None  . Alcohol Use: 0.0 oz/week    0 Standard drinks or equivalent per week   Additional social history: Lives at home alone. Her family lives in [place], [place]. Admits to occasional wine consumption 1-2 glasses few times weekly, denies binge drinking or h/o heavy alcohol consumption. Denies any smoking, or other drugs.   Please also refer to relevant sections of EMR.  Family History: Family History  Problem Relation Age of Onset  . Adopted: Yes   Allergies and Medications: Allergies  Allergen Reactions  . Wellbutrin [Bupropion] Anaphylaxis  . Morphine And Related Other (See Comments)    Inside and out of body felt like it was on fire  . Sulfur Hives  . Penicillins Hives   No current facility-administered medications on file prior to encounter.   Current Outpatient Prescriptions on File Prior to Encounter  Medication Sig Dispense Refill  . metoprolol tartrate (LOPRESSOR) 25 MG tablet Take 2 tablets (50 mg total) by mouth 3 (three) times daily. 180 tablet 6  . verapamil (CALAN) 40 MG tablet Take 1 tablet (40 mg total) by mouth 3 (three) times daily. 90 tablet 6  . diphenhydrAMINE (BENADRYL) 25 MG tablet Take 25 mg by mouth every 6 (six) hours as needed.    Marland Kitchen  furosemide (LASIX) 20 MG tablet Take 1 tablet (20 mg total) by mouth daily as needed for fluid or edema (only). 30 tablet 6  . meclizine (ANTIVERT) 25 MG tablet Take 1 tablet (25 mg total) by mouth 3 (three) times daily as needed for dizziness. 30 tablet 0     Objective: BP 132/80 mmHg  Pulse 73  Temp(Src) 98.6 F (37 C) (Oral)  Resp 16  Ht _0  (1.575 m)  Wt 125 lb (56.7 kg)  BMI 22.86 kg/m2  SpO2 99% Exam: General: well-appearing, cooperative, conversational, mild discomfort d/t abdominal pain (controlled on pain med), NAD Eyes: PERRL, EOMI, anicteric sclera, no conj injection ENTM: NCAT, normal nares, moist mucus membranes and tongue Neck: Supple, non-tender, no JVD Cardiovascular: RRR, S1, S2, 2/6 systolic ejection murmur L-SB Respiratory: Mostly CTAB with very minimal bibasilar crackles, no wheezing or focal crackles. Good air movement. No supplemental O2. Speaks full sentences. Abdomen: soft, mild generalized abd distention, moderate tenderness upper quads L>R with epigastric +TTP, no guarding or rebound, no peritoneal signs, negative Murphy's, +active BS MSK: No significant lower ext edema. Back non-tender, no deformities. Skin: warm, dry, no rash Neuro: AAOx3, CN-II-XII grossly intact, muscle str grip 5/5, distal sensations intact Psych: appropriate mood, good insight into condition  Labs and Imaging: CBC BMET   Recent Labs Lab 04/13/15 0500  WBC 9.0  HGB 15.4*  HCT 44.7  PLT 263    Recent Labs Lab 04/13/15 0500  NA 134*  K 3.3*  CL 95*  CO2 23  BUN <5*  CREATININE 0.57  GLUCOSE 116*  CALCIUM 9.2     Lipase 265 LDH 327 AST 159 / ALT 74 Alk phos 186 T.Bili 1.9 Trop neg  9/22 CT Abd/Pelvis w/ contrast IMPRESSION: 1. Abnormal extrahepatic biliary dilatation possibly with a distal CBD filling defect, concerning for choledocholithiasis and obstruction. There is some accentuated mucosal enhancement in the gallbladder. Inflammatory stranding around the pancreatic head and descending duodenum may be from secondary inflammation; there is no dilatation of the dorsal pancreatic duct. Pancreatitis mainly involving the pancreatic head would be a differential diagnostic consideration. The possibility of a  small mass in the vicinity of the ampulla is not totally excluded. Consider right upper quadrant sonography to assess for gallstones. 2. Heterogeneous hypodensity throughout the liver but especially along the gallbladder fossa and in segment 8. There is thought to be underlying diffuse hepatic steatosis and this may well represent heterogeneity of steatosis. No portal or hepatic vein thrombosis is identified to suggest a vascular occlusive cause for this heterogeneity. Cholangitis is a possibility but the extrahepatic bile duct wall does not appear to significantly abnormally enhance. Hepatitis is not entirely excluded. 3. Several hypodense lesions in the liver are likely cysts. 4. Multiple uterine fibroids. 5. Mesenteric edema, especially around the pancreatic head region.  RUQ Abd US IMPRESSION: 1. Extrahepatic biliary dilatation without demonstrated common duct stone. Given the apparent findings of acute pancreatitis on CT, these findings may be secondary to a recently passed stone or edema at the ampulla. If there is persistent concern of bilirubin obstruction, follow-up MRCP may be helpful. 2. No evidence of gallstones or cholecystitis. 3. Heterogeneous hepatic steatosis with simple hepatic cysts.  CXR Portable 1v FINDINGS: The heart size and mediastinal contours are within normal limits. Both lungs are clear. The visualized skeletal structures are unremarkable. IMPRESSION: No active disease.  ED EKG EKG - NSR, HR 71, bi-atrial enlargement, LVH (HoCM)  Olin Hauser, DO 04/13/2015, 11:16 AM PGY-3, [facility] pager: [phone], text pages welcome

## 2015-04-14 ENCOUNTER — Inpatient Hospital Stay (HOSPITAL_COMMUNITY): Payer: No Typology Code available for payment source | Admitting: Certified Registered"

## 2015-04-14 ENCOUNTER — Encounter (HOSPITAL_COMMUNITY)
Admission: EM | Disposition: A | Discharge status: Home / Self Care | Payer: Self-pay | Source: Home / Self Care | Attending: Family Medicine

## 2015-04-14 ENCOUNTER — Encounter (HOSPITAL_COMMUNITY): Payer: Self-pay

## 2015-04-14 ENCOUNTER — Encounter (HOSPITAL_COMMUNITY)
Admission: EM | Disposition: A | Discharge status: Home / Self Care | Payer: No Typology Code available for payment source | Source: Home / Self Care | Attending: Family Medicine

## 2015-04-14 DIAGNOSIS — K831 Obstruction of bile duct: Secondary | ICD-10-CM

## 2015-04-14 HISTORY — PX: EUS: SHX5427

## 2015-04-14 LAB — COMPREHENSIVE METABOLIC PANEL
ALBUMIN: 3.2 g/dL — AB (ref 3.5–5.0)
ALT: 47 U/L (ref 14–54)
ANION GAP: 7 (ref 5–15)
AST: 100 U/L — AB (ref 15–41)
Alkaline Phosphatase: 148 U/L — ABNORMAL HIGH (ref 38–126)
BILIRUBIN TOTAL: 1.4 mg/dL — AB (ref 0.3–1.2)
CHLORIDE: 98 mmol/L — AB (ref 101–111)
CO2: 30 mmol/L (ref 22–32)
Calcium: 8.1 mg/dL — ABNORMAL LOW (ref 8.9–10.3)
Creatinine, Ser: 0.61 mg/dL (ref 0.44–1.00)
GFR calc Af Amer: >60 mL/min (ref >60)
GFR calc non Af Amer: >60 mL/min (ref >60)
GLUCOSE: 96 mg/dL (ref 65–99)
POTASSIUM: 3 mmol/L — AB (ref 3.5–5.1)
SODIUM: 135 mmol/L (ref 135–145)
TOTAL PROTEIN: 5.7 g/dL — AB (ref 6.5–8.1)

## 2015-04-14 LAB — CBC
HEMATOCRIT: 39.7 % (ref 36.0–46.0)
Hemoglobin: 13.6 g/dL (ref 12.0–15.0)
MCH: 39.3 pg — ABNORMAL HIGH (ref 26.0–34.0)
MCHC: 34.3 g/dL (ref 30.0–36.0)
MCV: 114.7 fL — AB (ref 78.0–100.0)
Platelets: 203 10*3/uL (ref 150–400)
RBC: 3.46 MIL/uL — ABNORMAL LOW (ref 3.87–5.11)
RDW: 15.2 % (ref 11.5–15.5)
WBC: 10 10*3/uL (ref 4.0–10.5)

## 2015-04-14 LAB — URINALYSIS, ROUTINE W REFLEX MICROSCOPIC
Bilirubin Urine: NEGATIVE
GLUCOSE, UA: NEGATIVE mg/dL
Hgb urine dipstick: NEGATIVE
KETONES UR: NEGATIVE mg/dL
LEUKOCYTES UA: NEGATIVE
NITRITE: NEGATIVE
PROTEIN: NEGATIVE mg/dL
Specific Gravity, Urine: 1.006 (ref 1.005–1.030)
Urobilinogen, UA: 1 mg/dL (ref 0.0–1.0)
pH: 6.5 (ref 5.0–8.0)

## 2015-04-14 LAB — TRIGLYCERIDES: Triglycerides: 187 mg/dL — ABNORMAL HIGH (ref <150)

## 2015-04-14 LAB — MAGNESIUM: MAGNESIUM: 1.3 mg/dL — AB (ref 1.7–2.4)

## 2015-04-14 SURGERY — ERCP, WITH INTERVENTION IF INDICATED
Anesthesia: General

## 2015-04-14 SURGERY — ESOPHAGEAL ENDOSCOPIC ULTRASOUND (EUS) RADIAL
Anesthesia: General

## 2015-04-14 SURGERY — ERCP, WITH INTERVENTION IF INDICATED
Anesthesia: Monitor Anesthesia Care

## 2015-04-14 SURGERY — CANCELLED PROCEDURE

## 2015-04-14 MED ORDER — DIPHENHYDRAMINE HCL 25 MG PO CAPS: 50.0000 mg | ORAL_CAPSULE | Freq: Four times a day (QID) | ORAL | Status: DC | PRN

## 2015-04-14 MED ORDER — ONDANSETRON HCL 4 MG/2ML IJ SOLN
INTRAMUSCULAR | Status: AC
  Filled 2015-04-14: qty 2

## 2015-04-14 MED ORDER — EPHEDRINE SULFATE 50 MG/ML IJ SOLN
INTRAMUSCULAR | Status: DC | PRN
  Administered 2015-04-14: 10 mg via INTRAVENOUS

## 2015-04-14 MED ORDER — PROMETHAZINE HCL 25 MG/ML IJ SOLN: 6.2500 mg | INTRAMUSCULAR | Status: DC | PRN

## 2015-04-14 MED ORDER — DIPHENHYDRAMINE HCL 25 MG PO CAPS
50.0000 mg | ORAL_CAPSULE | Freq: Four times a day (QID) | ORAL | Status: DC | PRN
  Administered 2015-04-14 (×3): 50 mg via ORAL
  Filled 2015-04-14 (×4): qty 2

## 2015-04-14 MED ORDER — SUCCINYLCHOLINE CHLORIDE 20 MG/ML IJ SOLN
INTRAMUSCULAR | Status: DC | PRN
  Administered 2015-04-14: 100 mg via INTRAVENOUS

## 2015-04-14 MED ORDER — CIPROFLOXACIN IN D5W 400 MG/200ML IV SOLN
INTRAVENOUS | Status: AC
  Filled 2015-04-14: qty 200

## 2015-04-14 MED ORDER — PROPOFOL 10 MG/ML IV BOLUS
INTRAVENOUS | Status: AC
  Filled 2015-04-14: qty 20

## 2015-04-14 MED ORDER — LACTATED RINGERS IV SOLN
INTRAVENOUS | Status: DC
  Administered 2015-04-14: 1000 mL via INTRAVENOUS

## 2015-04-14 MED ORDER — SODIUM CHLORIDE 0.9 % IV SOLN: INTRAVENOUS | Status: DC

## 2015-04-14 MED ORDER — FENTANYL CITRATE (PF) 100 MCG/2ML IJ SOLN
INTRAMUSCULAR | Status: DC | PRN
  Administered 2015-04-14: 100 ug via INTRAVENOUS

## 2015-04-14 MED ORDER — PROPOFOL 10 MG/ML IV BOLUS
INTRAVENOUS | Status: DC | PRN
  Administered 2015-04-14: 130 mg via INTRAVENOUS

## 2015-04-14 MED ORDER — GLUCAGON HCL RDNA (DIAGNOSTIC) 1 MG IJ SOLR
INTRAMUSCULAR | Status: AC
  Filled 2015-04-14: qty 1

## 2015-04-14 MED ORDER — HYDROMORPHONE HCL 1 MG/ML IJ SOLN: 0.5000 mg | INTRAMUSCULAR | Status: DC | PRN

## 2015-04-14 MED ORDER — LIDOCAINE HCL (CARDIAC) 20 MG/ML IV SOLN
INTRAVENOUS | Status: AC
  Filled 2015-04-14: qty 5

## 2015-04-14 MED ORDER — POTASSIUM CHLORIDE CRYS ER 20 MEQ PO TBCR
40.0000 meq | EXTENDED_RELEASE_TABLET | Freq: Two times a day (BID) | ORAL | Status: AC
  Administered 2015-04-14 (×2): 40 meq via ORAL
  Filled 2015-04-14 (×2): qty 2

## 2015-04-14 MED ORDER — MAGNESIUM CHLORIDE 64 MG PO TBEC
1.0000 | DELAYED_RELEASE_TABLET | Freq: Every day | ORAL | Status: AC
  Administered 2015-04-14: 64 mg via ORAL
  Filled 2015-04-14 (×2): qty 1

## 2015-04-14 MED ORDER — MIDAZOLAM HCL 5 MG/5ML IJ SOLN
INTRAMUSCULAR | Status: DC | PRN
  Administered 2015-04-14: 2 mg via INTRAVENOUS

## 2015-04-14 MED ORDER — MIDAZOLAM HCL 2 MG/2ML IJ SOLN
INTRAMUSCULAR | Status: AC
  Filled 2015-04-14: qty 4

## 2015-04-14 MED ORDER — LIDOCAINE HCL (CARDIAC) 20 MG/ML IV SOLN
INTRAVENOUS | Status: DC | PRN
  Administered 2015-04-14: 50 mg via INTRAVENOUS

## 2015-04-14 MED ORDER — IBUPROFEN 200 MG PO TABS
400.0000 mg | ORAL_TABLET | Freq: Four times a day (QID) | ORAL | Status: DC
  Administered 2015-04-14 – 2015-04-15 (×3): 400 mg via ORAL
  Filled 2015-04-14: qty 1
  Filled 2015-04-14: qty 2
  Filled 2015-04-14 (×6): qty 1
  Filled 2015-04-14 (×2): qty 2
  Filled 2015-04-14: qty 1

## 2015-04-14 MED ORDER — LACTATED RINGERS IV SOLN: INTRAVENOUS | Status: DC

## 2015-04-14 MED ORDER — PHENYLEPHRINE HCL 10 MG/ML IJ SOLN
INTRAMUSCULAR | Status: DC | PRN
  Administered 2015-04-14 (×2): 80 ug via INTRAVENOUS

## 2015-04-14 MED ORDER — MEPERIDINE HCL 25 MG/ML IJ SOLN: 6.2500 mg | INTRAMUSCULAR | Status: DC | PRN

## 2015-04-14 MED ORDER — SODIUM CHLORIDE 0.9 % IV SOLN
INTRAVENOUS | Status: DC
  Administered 2015-04-14 – 2015-04-15 (×2): via INTRAVENOUS

## 2015-04-14 MED ORDER — FENTANYL CITRATE (PF) 250 MCG/5ML IJ SOLN
INTRAMUSCULAR | Status: AC
  Filled 2015-04-14: qty 25

## 2015-04-14 MED ORDER — DIPHENHYDRAMINE HCL 25 MG PO CAPS
25.0000 mg | ORAL_CAPSULE | Freq: Once | ORAL | Status: AC
  Administered 2015-04-14: 25 mg via ORAL

## 2015-04-14 NOTE — Progress Notes (Signed)
Family Medicine Teaching Service Daily Progress Note Intern Pager: [phone]  Patient name: Erin Berry Medical record number: [id number] Date of birth: Jul 17, 1961 Age: 54 y.o. Gender: female  Primary Care Provider: Robyn Haber, MD Consultants: GI, general surgery (curbside) Code Status: FULL  Pt Overview and Major Events to Date:  No acute events overnight, patient feels much improved this morning.   Assessment and Plan: Acute Pancreatitis, with dilated extrahepatic bilary duct without identified gallstones Clinically consistent with acute pancreatitis with elevated lipase 265, upper abd pain, confirmed on imaging with Abd CT, unclear exact etiology, suspect may be pancreatic duct edema (from chronic dCHF with some abd / mesenteric edema) vs passed gallstone (however none identified in gallbladder or in duct on RUQ Abd [place]). No alcohol consumption recently. Additionally with dilated extrahepatic biliary duct on imaging, not clinically consistent with cholangitis or cholecystitis (negative on imaging), concern with elevated alk phos and T.Bili 1.9, suggestive of biliary etiology (but not clearly identified). Triglycerides elevated on admission, possible lab error since resolved as well as on repeat lab. Ranson Score 1 on admit (0.9% mortality), APACHE-II score 4 (not considered severe), currently on admit with stable VS (hypertensive BP, otherwise afebrile, resp stable).  - NPO (+sips and meds) for ERCP today - IVF rehydration with NS @ 100cc/hr (lower IVF rate due to lower BMI and concern with h/o dCHF, caution fluid overload) - Pain Control - Dilaudid IV 87m q 3 hr PRN severe pain, Zofran PRN nausea, Benadryl PRN (itching with dilaudid) - GI to take to ERCP today  Hypertrophic Obstructive Cardiomyopathy Chronic known HOCM x 2-3 years. Followed by Cardiology (Dr. CSallyanne Kuster, currently on medical therapy with BB and CCB, s/p attempted prior septal ablation procedure, future plan for septal  myomectomy at outside hospital in 05/2015.  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 Currently no acute evidence of volume overload, does have notable abd distention and mesenteric edema on imaging. No lower ext edema, no JVD. Only on PRN Lasix. BNP on admission 103.7. Physical exam consistent with euvolemia.  - Strict intake output - Daily wts - NPO as above - Cautious with IVF NS @ 100cc/hr for now  Hypokalemia: K 3.3 on admission, repleted 1 dose of 49m33mrepeat K 3.0 this am. Indication to closely follow K 2/2 diastolic CHF. - replete K with 49mE65mD x 2 doses now - Mag level pending  FEN/GI: NS @ 100cc/hr while NPO with sips, meds Prophylaxis: Lovenox SQ  Disposition: Admit to inpatient for acute pancreatitis for IV analgesia and rehydration, ERCP pending today.  Subjective:  Pt feels much better this morning. Pain well controlled, requiring benadryl for itching 2/2 dilaudid, but pt would rather itch than be in pain. Pt reports subjective 40% decrease in abdominal swelling since admission. Pt inquiring about implications of potential but unlikely cholecystectomy as she has septal myomectomy scheduled on 06/20/15 at [facility] and they require a window without surgery several months prior to that procedure. Pt reassured that cholecystectomy is unlikely. Pt reports nausea much improved, has required Zofran once overnight.  Objective: Temp:  [97.8 F (36.6 C)-98.5 F (36.9 C)] 98.5 F (36.9 C) (09/23 0721) Pulse Rate:  [60-73] 71 (09/23 0721) Resp:  [14-23] 20 (09/23 0721) BP: (115-173)/(60-98) 115/60 mmHg (09/23 0721) SpO2:  [92 %-100 %] 92 % (09/23 0721) Physical Exam: General: Thin female lying comfortably in bed. Cardiovascular: systolic  ejection murmur 4/6 present, regular rate and rhythm; no LE edema  Respiratory: CTAB, no crackles, rhonci, or  wheezes Abdomen: tender to deep palpation in R & L UQ, more in RUQ, BS present, abdomen mildly protuberant, no guarding or rebound tenderness Extremities: no LE edema, or wounds. Mild excoriation rash on bilateral upper extremities 2/2 scratching with pt allergy to hydromorphone.  Laboratory:  Recent Labs Lab 04/13/15 0500 04/14/15 0245  WBC 9.0 10.0  HGB 15.4* 13.6  HCT 44.7 39.7  PLT 263 203    Recent Labs Lab 04/13/15 0500 04/14/15 0245  NA 134* 135  K 3.3* 3.0*  CL 95* 98*  CO2 23 30  BUN <5* <5*  CREATININE 0.57 0.61  CALCIUM 9.2 8.1*  PROT 7.0 5.7*  BILITOT 1.9* 1.4*  ALKPHOS 186* 148*  ALT 74* 47  AST 159* 100*  GLUCOSE 116* 96   Triglycerides : 1254 > 175 > 184   Imaging/Diagnostic Tests: Abdominal US and CT abdomen pelvis as in H&P  Sela Hilding, Med Student 04/14/2015, 7:32 AM Medical student - pager [phone], [place] Intern pager: [phone], text pages welcome  RESIDENT ADDENDUM  I have separately seen and examined the patient. I have discussed the findings and exam with the medical student, helped develop the management plan that is described in the student's note, and I agree with the content.  Additionally I have outlined my exam and assessment/plan below:   S:  Pt reporting significant improvement in pain. Some itching. Less bloated. O:  BP 105/58 mmHg  Pulse 71  Temp(Src) 98.5 F (36.9 C) (Oral)  Resp 22  Ht _0  (1.575 m)  Wt 125 lb (56.7 kg)  BMI 22.86 kg/m2  SpO2 92%  Gen: Thin female in no distress CV: RRR, IV/VI systolic murmur across precordium without gallop. No LE edema or JVD Pulm: Nonlabored and clear GI: epigastric tenderness without rebound or guarding  A/P: Acute pancreatitis with evidence of CBD obstruction (passed stone vs. ampulla edema). Hypertriglyceridemia not primary cause, no history of DM with A1c still pending.  - ERCP today, then return to diet and D/C IVF. Wean to po analgesia  as able.  - Can transfer to floor today.  HOCM: Hemodynamically stable - Continue negative inotropes and telemetry, holding prn lasix.   Ryan B. Bonner Puna, MD, PGY-3 04/14/2015 10:40 AM

## 2015-04-14 NOTE — Transfer of Care (Signed)
Immediate Anesthesia Transfer of Care Note  Patient: Erin Berry  Procedure(s) Performed: Procedure(s): ESOPHAGEAL ENDOSCOPIC ULTRASOUND (EUS) RADIAL (N/A) ENDOSCOPIC RETROGRADE CHOLANGIOPANCREATOGRAPHY (ERCP) (N/A)  Patient Location: PACU  Anesthesia Type:General  Level of Consciousness: awake, alert  and oriented  Airway & Oxygen Therapy: Patient Spontanous Breathing and Patient connected to face mask oxygen  Post-op Assessment: Report given to RN and Post -op Vital signs reviewed and stable  Post vital signs: Reviewed and stable  Last Vitals:  Filed Vitals:   04/14/15 1136  BP: 119/82  Pulse: 73  Temp: 37.5 C  Resp: 13    Complications: No apparent anesthesia complications

## 2015-04-14 NOTE — Interval H&P Note (Signed)
History and Physical Interval Note:  04/14/2015 12:15 PM  Erin Berry  has presented today for surgery, with the diagnosis of stones,pancreaatitis  The various methods of treatment have been discussed with the patient and family. After consideration of risks, benefits and other options for treatment, the patient has consented to  Procedure(s): ESOPHAGEAL ENDOSCOPIC ULTRASOUND (EUS) RADIAL (N/A) ENDOSCOPIC RETROGRADE CHOLANGIOPANCREATOGRAPHY (ERCP) (N/A) as a surgical intervention .  The patient's history has been reviewed, patient examined, no change in status, stable for surgery.  I have reviewed the patient's chart and labs.  Questions were answered to the patient's satisfaction.     HUNG,PATRICK D

## 2015-04-14 NOTE — Anesthesia Procedure Notes (Signed)
Procedure Name: Intubation Date/Time: 04/14/2015 12:33 PM Performed by: Carolyne Fiscal, Lataya Varnell F Pre-anesthesia Checklist: Patient identified, Emergency Drugs available, Suction available, Patient being monitored and Timeout performed Patient Re-evaluated:Patient Re-evaluated prior to inductionOxygen Delivery Method: Circle system utilized Preoxygenation: Pre-oxygenation with 100% oxygen Intubation Type: IV induction Ventilation: Mask ventilation without difficulty Laryngoscope Size: Miller and 2 Grade View: Grade I Tube type: Oral Tube size: 7.0 mm Number of attempts: 1 Airway Equipment and Method: Stylet Placement Confirmation: ETT inserted through vocal cords under direct vision,  positive ETCO2 and breath sounds checked- equal and bilateral Secured at: 22 cm Tube secured with: Tape Dental Injury: Teeth and Oropharynx as per pre-operative assessment

## 2015-04-14 NOTE — Op Note (Signed)
[facility] [address] [place], [address]   UPPER ENDOSCOPIC ULTRASOUND PROCEDURE REPORT     EXAM DATE: 04/14/2015  PATIENT NAME:          Erin Berry, Erin Berry          MR#: [id number] BIRTHDATE:       03-09-1961     VISIT #:     [id number] ATTENDING:     Jeani Hawking, MD     STATUS:     outpatient ASSISTANT:      Beryle Beams and Priscella Mann  REFERRING MD: ASA CLASS:        Class III  INDICATIONS:  The patient is a 54 yr old female here for a lower endoscopic ultrasound due to pancreatitis PROCEDURE PERFORMED:     EUS  MEDICATIONS:     General anesthesia  CONSENT: The patient understands the risks and benefits of the procedure and understands that these risks include, but are not limited to: sedation, allergic reaction, infection, perforation and/or bleeding. Alternative means of evaluation and treatment include, among others: physical exam, x-rays, and/or surgical intervention. The patient elects to proceed with this endoscopic procedure.  DESCRIPTION OF PROCEDURE: During intra-op preparation period all mechanical & medical equipment was checked for proper function. Hand hygiene and appropriate measures for infection prevention was taken. After the risks, benefits and alternatives of the procedure were thoroughly explained, Informed consent was verified, confirmed and timeout was successfully executed by the treatment team. The patient was then placed in the left, lateral, decubitus position and IV sedation was administered. Throughout the procedure, the patients blood pressure, pulse and oxygen saturations were monitored continuously. Under direct visualization, the Pentax EUS Linear [id number] was introduced through the mouth and advanced to the second portion of the duodenum.  Water was used as necessary to provide an acoustic interface. The pulse, BP, and O2 saturation were monitored and documented by the physician and nursing  staff throughout the procedure. Upon completion of the imaging, water was removed and the patient was then discharged to recovery in stable condition with the appropriate post procedure care. Estimated blood loss is zero unless otherwise noted in this procedure report.   FINDINGS: The CBD was clearly identified and the entire length was evaluated.  The distal portion of the CBD measured 5 mm and the proximal portion was dilated at 12 mm.  There was NO evidence of any stones, sludge, or strictures in the CBD.  The PD measured 3.5 mm in the body of the pancreas and the parenchyma exhibited changes consistent with pancreatitis.  No masses or cystic lesions noted in the pancreas.  The celiac axis was normal.  The gallbladder was also visualized and there was no evidence of any stones or sludge. Gross visualization of the ampulla, image 004, was negative for any evidence of a mass or any ampullary abnormality.    ADVERSE EVENTS:     There were no immediate complications. IMPRESSIONS:     1) Dilated mid to proximal CBD. 2) Pancreatitis. 3) Normal gallbladder.  RECOMMENDATIONS:     1) Continue IV hydration. 2) Continue with pain control. 3) Control triglycerides. 4) Follow up with Dr. Loreta Ave in 2-4 weeks upon discharge.  ___________________________________ Jeani Hawking, MD eSigned:  Jeani Hawking, MD 04/14/2015 1:09 PM   cc:      PATIENT NAME:  Erin Berry, Erin Berry MR#: [id number]

## 2015-04-14 NOTE — Anesthesia Postprocedure Evaluation (Signed)
  Anesthesia Post-op Note  Patient: Erin Berry  Procedure(s) Performed: Procedure(s): ESOPHAGEAL ENDOSCOPIC ULTRASOUND (EUS) RADIAL (N/A) ENDOSCOPIC RETROGRADE CHOLANGIOPANCREATOGRAPHY (ERCP) (N/A)  Patient Location: Endoscopy Unit  Anesthesia Type:General  Level of Consciousness: awake, alert  and oriented  Airway and Oxygen Therapy: Patient Spontanous Breathing  Post-op Pain: none  Post-op Assessment: Post-op Vital signs reviewed and Patient's Cardiovascular Status Stable              Post-op Vital Signs: Reviewed and stable  Last Vitals:  Filed Vitals:   04/14/15 1350  BP: 123/66  Pulse: 78  Temp:   Resp: 20    Complications: No apparent anesthesia complications

## 2015-04-14 NOTE — Anesthesia Preprocedure Evaluation (Addendum)
Anesthesia Evaluation  Patient identified by MRN, date of birth, ID band Patient awake    Reviewed: Allergy & Precautions, NPO status , Patient's Chart, lab work & pertinent test results, reviewed documented beta blocker date and time   Airway Mallampati: II  TM Distance: >3 FB Neck ROM: Full    Dental  (+) Teeth Intact   Pulmonary neg pulmonary ROS,    breath sounds clear to auscultation       Cardiovascular negative cardio ROS   Rhythm:Regular Rate:Normal     Neuro/Psych negative neurological ROS  negative psych ROS   GI/Hepatic negative GI ROS, Neg liver ROS,   Endo/Other  negative endocrine ROS  Renal/GU negative Renal ROS  negative genitourinary   Musculoskeletal negative musculoskeletal ROS (+)   Abdominal   Peds negative pediatric ROS (+)  Hematology negative hematology ROS (+)   Anesthesia Other Findings   Reproductive/Obstetrics negative OB ROS                            Lab Results  Component Value Date   WBC 10.0 04/14/2015   HGB 13.6 04/14/2015   HCT 39.7 04/14/2015   MCV 114.7* 04/14/2015   PLT 203 04/14/2015   Lab Results  Component Value Date   CREATININE 0.61 04/14/2015   BUN <5* 04/14/2015   NA 135 04/14/2015   K 3.0* 04/14/2015   CL 98* 04/14/2015   CO2 30 04/14/2015   No results found for: INR, PROTIME  EKG: normal sinus rhythm.  Echo: SUMMARY - Overall left ventricular systolic function was normal. Left    ventricular ejection fraction was estimated , range being 60    % to 70 %. - Estimated PAP by TR jet is approximately 45 mm Hg. - IVC is mild to moderately dilated. Respirophasic changes in    inferior vena cava dimension were absent.  Anesthesia Physical Anesthesia Plan  ASA: II  Anesthesia Plan: General   Post-op Pain Management:    Induction: Intravenous  Airway Management Planned: Oral ETT  Additional Equipment:    Intra-op Plan:   Post-operative Plan: Extubation in OR  Informed Consent: I have reviewed the patients History and Physical, chart, labs and discussed the procedure including the risks, benefits and alternatives for the proposed anesthesia with the patient or authorized representative who has indicated his/her understanding and acceptance.   Dental advisory given  Plan Discussed with: CRNA  Anesthesia Plan Comments:         Anesthesia Quick Evaluation

## 2015-04-14 NOTE — Progress Notes (Signed)
Called Dr. Jarvis Newcomer for further clarification regarding insulin gtt orders. MD called back and stated that triglycerides had decreased and there was no longer need for insult gtt. Will monitor.

## 2015-04-14 NOTE — H&P (View-Only) (Signed)
Reason for Consult: Gallstone pancreatitis Referring Physician: Family Medicine  Erin Berry HPI: This is a 54 year old female admitted with complaints of acute upper abdominal pain, nausea, and vomiting.  Further work up revealed that she had elevation in her lipase and a CT scan confirmed an acute pancreatitis.  There was also dilation of the extrahepatic ducts and the suspicion of a distal CBD stone.  No stone was noted in the gallbladder and there is no evidence of an acute cholecystitis.  Past Medical History  Diagnosis Date  . Cardiomyopathy   . Hypertrophic obstructive cardiomyopathy (HOCM) 12/02/2014  . Vertigo     Past Surgical History  Procedure Laterality Date  . Appendectomy    . Cesarean section      Family History  Problem Relation Age of Onset  . Adopted: Yes    Social History:  reports that she has never smoked. She does not have any smokeless tobacco history on file. She reports that she drinks alcohol. She reports that she does not use illicit drugs.  Allergies:  Allergies  Allergen Reactions  . Wellbutrin [Bupropion] Anaphylaxis  . Morphine And Related Other (See Comments)    Inside and out of body felt like it was on fire  . Sulfur Hives  . Penicillins Hives    Medications:  Scheduled: . enoxaparin (LOVENOX) injection  40 mg Subcutaneous Q24H  . metoprolol tartrate  50 mg Oral TID  . sodium chloride  3 mL Intravenous Q12H  . verapamil  40 mg Oral TID   Continuous: . sodium chloride 100 mL/hr at 04/13/15 1600    Results for orders placed or performed during the hospital encounter of 04/13/15 (from the past 24 hour(s))  Comprehensive metabolic panel     Status: Abnormal   Collection Time: 04/13/15  5:00 AM  Result Value Ref Range   Sodium 134 (L) 135 - 145 mmol/L   Potassium 3.3 (L) 3.5 - 5.1 mmol/L   Chloride 95 (L) 101 - 111 mmol/L   CO2 23 22 - 32 mmol/L   Glucose, Bld 116 (H) 65 - 99 mg/dL   BUN <5 (L) 6 - 20 mg/dL   Creatinine, Ser  0.57 0.44 - 1.00 mg/dL   Calcium 9.2 8.9 - 10.3 mg/dL   Total Protein 7.0 6.5 - 8.1 g/dL   Albumin 4.1 3.5 - 5.0 g/dL   AST 159 (H) 15 - 41 U/L   ALT 74 (H) 14 - 54 U/L   Alkaline Phosphatase 186 (H) 38 - 126 U/L   Total Bilirubin 1.9 (H) 0.3 - 1.2 mg/dL   GFR calc non Af Amer >60 >60 mL/min   GFR calc Af Amer >60 >60 mL/min   Anion gap 16 (H) 5 - 15  Lipase, blood     Status: Abnormal   Collection Time: 04/13/15  5:00 AM  Result Value Ref Range   Lipase 265 (H) 22 - 51 U/L  CBC with Differential     Status: Abnormal   Collection Time: 04/13/15  5:00 AM  Result Value Ref Range   WBC 9.0 4.0 - 10.5 K/uL   RBC 3.97 3.87 - 5.11 MIL/uL   Hemoglobin 15.4 (H) 12.0 - 15.0 g/dL   HCT 44.7 36.0 - 46.0 %   MCV 112.6 (H) 78.0 - 100.0 fL   MCH 38.8 (H) 26.0 - 34.0 pg   MCHC 34.5 30.0 - 36.0 g/dL   RDW 14.9 11.5 - 15.5 %   Platelets 263 150 - 400   K/uL   Neutrophils Relative % 70 %   Lymphocytes Relative 23 %   Monocytes Relative 7 %   Eosinophils Relative 0 %   Basophils Relative 0 %   Neutro Abs 6.3 1.7 - 7.7 K/uL   Lymphs Abs 2.1 0.7 - 4.0 K/uL   Monocytes Absolute 0.6 0.1 - 1.0 K/uL   Eosinophils Absolute 0.0 0.0 - 0.7 K/uL   Basophils Absolute 0.0 0.0 - 0.1 K/uL   RBC Morphology STOMATOCYTES   Lactate dehydrogenase     Status: Abnormal   Collection Time: 04/13/15  5:00 AM  Result Value Ref Range   LDH 327 (H) 98 - 192 U/L  Brain natriuretic peptide     Status: Abnormal   Collection Time: 04/13/15  5:00 AM  Result Value Ref Range   B Natriuretic Peptide 103.7 (H) 0.0 - 100.0 pg/mL  Lipid panel     Status: Abnormal   Collection Time: 04/13/15  5:00 AM  Result Value Ref Range   Cholesterol 218 (H) 0 - 200 mg/dL   Triglycerides 1254 (H) <150 mg/dL   HDL NOT REPORTED DUE TO HIGH TRIGLYCERIDES >40 mg/dL   Total CHOL/HDL Ratio NOT REPORTED DUE TO HIGH TRIGLYCERIDES RATIO   VLDL UNABLE TO CALCULATE IF TRIGLYCERIDE OVER 400 mg/dL 0 - 40 mg/dL   LDL Cholesterol UNABLE TO CALCULATE  IF TRIGLYCERIDE OVER 400 mg/dL 0 - 99 mg/dL  I-stat troponin, ED     Status: None   Collection Time: 04/13/15  5:10 AM  Result Value Ref Range   Troponin i, poc 0.01 0.00 - 0.08 ng/mL   Comment 3             Ct Abdomen Pelvis W Contrast  04/13/2015   CLINICAL DATA:  Generalized abdominal pain. Nausea and vomiting. Upper abdominal tenderness.  EXAM: CT ABDOMEN AND PELVIS WITH CONTRAST  TECHNIQUE: Multidetector CT imaging of the abdomen and pelvis was performed using the standard protocol following bolus administration of intravenous contrast.  CONTRAST:  100mL OMNIPAQUE IOHEXOL 300 MG/ML  SOLN  COMPARISON:  None.  FINDINGS: Lower chest:  Unremarkable  Hepatobiliary: Extensive and heterogeneous hepatic steatosis. Hypodense 8 mm focal lesion in the lateral segment left hepatic lobe, image 21 series 2. A second hypodense lesion in the lateral segment left hepatic lobe on image 31 series 2 measures 1.7 by 1.3 cm. There are some other focal hypodensities which are most likely a manifestation of focally more severe hepatic steatosis in the liver. The steatosis is somewhat heterogeneous and speckled in appearance.  Mildly accentuated mucosal thickening of the gallbladder. Abnormally dilated extrahepatic biliary system with common bile duct at 10 mm. Possible filling defect in the distal common bile duct on image 33 of series 5, cannot exclude CBD stone. Cholangitis is also not excluded.  Pancreas: Peripancreatic stranding in the vicinity of the pancreatic head is specially. The dorsal pancreatic duct does not appear dilated.  Spleen: Unremarkable  Adrenals/Urinary Tract: Unremarkable  Stomach/Bowel: Abnormal inflammatory stranding adjacent to the descending duodenum and proximal third portion of the duodenum with some associated mucosal enhancement. Nondistended segment of descending colon also demonstrates mucosal enhancement and potentially mild wall thickening. Appendix absent.  Vascular/Lymphatic: Small  retroperitoneal and porta hepatis lymph nodes are likely reactive. Minimal aortoiliac atherosclerosis.  Reproductive: New multiple uterine fibroids, largest approximately 3 cm along the left posterior uterine body. No endometrial thickening.  Other: Mesenteric edema especially around the pancreatic head, with a small amount of fluid tracking along the right paracolic   gutter. Small rim calcified nodule along the inferior portion of the right paracolic gutter on image 63 series 2, likely a residua from remote inflammation.  Musculoskeletal: Unremarkable  IMPRESSION: 1. Abnormal extrahepatic biliary dilatation possibly with a distal CBD filling defect, concerning for choledocholithiasis and obstruction. There is some accentuated mucosal enhancement in the gallbladder. Inflammatory stranding around the pancreatic head and descending duodenum may be from secondary inflammation; there is no dilatation of the dorsal pancreatic duct. Pancreatitis mainly involving the pancreatic head would be a differential diagnostic consideration. The possibility of a small mass in the vicinity of the ampulla is not totally excluded. Consider right upper quadrant sonography to assess for gallstones. 2. Heterogeneous hypodensity throughout the liver but especially along the gallbladder fossa and in segment 8. There is thought to be underlying diffuse hepatic steatosis and this may well represent heterogeneity of steatosis. No portal or hepatic vein thrombosis is identified to suggest a vascular occlusive cause for this heterogeneity. Cholangitis is a possibility but the extrahepatic bile duct wall does not appear to significantly abnormally enhance. Hepatitis is not entirely excluded. 3. Several hypodense lesions in the liver are likely cysts. 4. Multiple uterine fibroids. 5. Mesenteric edema, especially around the pancreatic head region.   Electronically Signed   By: Walter  Liebkemann M.D.   On: 04/13/2015 07:29   Us Abdomen  Limited  04/13/2015   CLINICAL DATA:  Right upper quadrant abdominal pain for 1 day. Initial encounter.  EXAM: US ABDOMEN LIMITED - RIGHT UPPER QUADRANT  COMPARISON:  CT 04/13/2015.  FINDINGS: Gallbladder:  No gallstones or wall thickening visualized. No sonographic Murphy sign noted.  Common bile duct:  Diameter: 12 mm.  No intraductal calculi visualized.  Liver:  The liver is heterogeneously echogenic, corresponding with severe steatosis on CT. Two simple cysts are noted, measuring up to 1.7 cm in diameter. No focally suspicious lesion.  Pancreas not evaluated by this examination.  IMPRESSION: 1. Extrahepatic biliary dilatation without demonstrated common duct stone. Given the apparent findings of acute pancreatitis on CT, these findings may be secondary to a recently passed stone or edema at the ampulla. If there is persistent concern of bilirubin obstruction, follow-up MRCP may be helpful. 2. No evidence of gallstones or cholecystitis. 3. Heterogeneous hepatic steatosis with simple hepatic cysts.   Electronically Signed   By: William  Veazey M.D.   On: 04/13/2015 10:05   Dg Chest Port 1 View  04/13/2015   CLINICAL DATA:  Generalized abdominal pain and distention. Emesis. Dyspnea.  EXAM: PORTABLE CHEST 1 VIEW  COMPARISON:  04/13/2015  FINDINGS: The heart size and mediastinal contours are within normal limits. Both lungs are clear. The visualized skeletal structures are unremarkable.  IMPRESSION: No active disease.   Electronically Signed   By: Walter  Liebkemann M.D.   On: 04/13/2015 12:23    ROS:  As stated above in the HPI otherwise negative.  Blood pressure 173/71, pulse 69, temperature 98.6 F (37 C), temperature source Oral, resp. rate 20, height 5' 2" (1.575 m), weight 56.7 kg (125 lb), SpO2 99 %.    PE: Gen: NAD, Alert and Oriented HEENT:  [place]/AT, EOMI Neck: Supple, no LAD Lungs: CTA Bilaterally CV: RRR without M/G/R ABM: Soft, tender in the epigastric region Ext: No  C/C/E  Assessment/Plan: 1) Dilated CBD. 2) Elevated liver enzymes. 3) Acute pancreatitis - ? Gallstone origin.  Plan: 1) ERCP tomorrow.  HUNG,PATRICK D 04/13/2015, 2:44 PM      

## 2015-04-15 DIAGNOSIS — K859 Acute pancreatitis, unspecified: Secondary | ICD-10-CM

## 2015-04-15 MED ORDER — ONDANSETRON HCL 4 MG PO TABS: 4.0000 mg | ORAL_TABLET | Freq: Four times a day (QID) | ORAL | Status: DC | PRN

## 2015-04-15 MED ORDER — DOCUSATE SODIUM 100 MG PO CAPS: 100.0000 mg | ORAL_CAPSULE | Freq: Two times a day (BID) | ORAL | Status: AC | PRN

## 2015-04-15 NOTE — Progress Notes (Signed)
Family Medicine Teaching Service Daily Progress Note Intern Pager: [phone]  Patient name: Erin Berry Medical record number: [id number] Date of birth: 1961/05/23 Age: 54 y.o. Gender: female  Primary Care Arlo Butt: Robyn Haber, MD Consultants: GI, general surgery (curbside) Code Status: FULL  Pt Overview and Major Events to Date:  No acute events overnight, patient feels much improved this morning.   Assessment and Plan: Acute Pancreatitis, with dilated extrahepatic bilary duct without identified gallstones Clinically consistent with acute pancreatitis with elevated lipase 265, upper abd pain, confirmed on imaging with Abd CT, unclear exact etiology, suspect may be pancreatic duct edema (from chronic dCHF with some abd / mesenteric edema) vs passed gallstone (however none identified in gallbladder or in duct on RUQ Abd [place]). No alcohol consumption recently. Additionally with dilated extrahepatic biliary duct on imaging, not clinically consistent with cholangitis or cholecystitis (negative on imaging), concern with elevated alk phos and T.Bili 1.9, suggestive of biliary etiology (but not clearly identified). Triglycerides elevated on admission, possible lab error since resolved as well as on repeat lab. Ranson Score 1 on admit (0.9% mortality), APACHE-II score 4 (not considered severe). - ERCP performed 9/23: dilated mid to proximal CBD, pancreatitis, normal gallbladder  - SLIV as patient is now on clear liquid diet and concern for fluid overload in her given cardiac history  - Pain Control - Dilaudid IV 88m q 3 hr PRN severe pain ---> transitioned to Ibuprofen 4043mq 6 hrs tolerated well overnight, Zofran PRN nausea, Benadryl PRN (itching with dilaudid) -will need to f/u with GI outpatient in 2-4 weeks    Hypertrophic Obstructive Cardiomyopathy Chronic known HOCM x 2-3 years. Followed by Cardiology (Dr. CrSallyanne Kuster currently on medical therapy with BB and CCB, s/p attempted prior septal  ablation procedure, future plan for septal myomectomy at outside hospital in 05/2015.  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 Currently no acute evidence of volume overload, does have notable abd distention and mesenteric edema on imaging. No lower ext edema, no JVD. Only on PRN Lasix. BNP on admission 103.7. Physical exam consistent with euvolemia.  - Strict intake output - Daily wts   Hypokalemia/Hypomagenesia: K 3.3 on admission, repleted 1 dose of 57mE28mepeat K 3.0. Mg 1.3.  Indication to closely follow K 2/2 diastolic CHF. - repleted K with 57mEq8m x 2 doses  -SLOW-MAG 64 mg tablet given   FEN/GI: NS @ 100cc/hr while NPO with sips, meds Prophylaxis: Lovenox SQ  Disposition: Admit to inpatient for acute pancreatitis; home pending tolerating oral pain medication and regular diet   Subjective:  Feels much better this morning. Tolerating clears without nausea. Pain well controlled with ibuprofen. Very anxious to go home this morning.   Objective: Temp:  [98.1 F (36.7 C)-99.5 F (37.5 C)] 98.1 F (36.7 C) (09/24 0737) Pulse Rate:  [69-86] 69 (09/24 0737) Resp:  [13-24] 18 (09/24 0737) BP: (95-136)/(56-92) 136/92 mmHg (09/24 0737) SpO2:  [91 %-97 %] 94 % (09/24 0737) Weight:  [123 lb 14.4 oz (56.2 kg)] 123 lb 14.4 oz (56.2 kg) (09/24 0420) Physical Exam: General: Thin female lying comfortably in bed. Cardiovascular: systolic ejection murmur 4/6 present, regular rate and rhythm; no LE edema  Respiratory: CTAB, no crackles, rhonci, or wheezes Abdomen: mildly tender to deep palpation in R & L UQ, more in RUQ, BS present, abdomen mildly protuberant, no guarding or rebound tenderness Extremities: no  LE edema, or wounds. Mild excoriation rash on bilateral upper extremities 2/2 scratching with pt allergy to hydromorphone.  Laboratory:  Recent  Labs Lab 04/13/15 0500 04/14/15 0245  WBC 9.0 10.0  HGB 15.4* 13.6  HCT 44.7 39.7  PLT 263 203    Recent Labs Lab 04/13/15 0500 04/14/15 0245  NA 134* 135  K 3.3* 3.0*  CL 95* 98*  CO2 23 30  BUN <5* <5*  CREATININE 0.57 0.61  CALCIUM 9.2 8.1*  PROT 7.0 5.7*  BILITOT 1.9* 1.4*  ALKPHOS 186* 148*  ALT 74* 47  AST 159* 100*  GLUCOSE 116* 96   Triglycerides : 1254 > 175 > 184  Mag 1.3   Imaging/Diagnostic Tests: Abdominal US and CT abdomen pelvis as in H&P  ERCP IMPRESSIONS:  1) Dilated mid to proximal CBD. 2) Pancreatitis. 3) Normal gallbladder.  Nicolette Bang, DO 04/15/2015, 8:21 AM FPTS Intern pager: [phone], text pages welcome

## 2015-04-15 NOTE — Discharge Instructions (Signed)

## 2015-04-17 LAB — HEMOGLOBIN A1C
HEMOGLOBIN A1C: 5.3 % (ref 4.8–5.6)
MEAN PLASMA GLUCOSE: 105 mg/dL

## 2015-04-17 NOTE — Discharge Summary (Signed)
[facility] Discharge Summary  Patient name: Erin Berry Medical record number: [id number] Date of birth: 07-20-1961 Age: 54 y.o. Gender: female Date of Admission: 04/13/2015  Date of Discharge: 04/15/2015 Admitting Physician: Blane Ohara McDiarmid, MD  Primary Care Provider: Robyn Haber, MD Consultants: GI, general surgery (curbside)  Indication for Hospitalization: Acute pancreatitis  Discharge Diagnoses/Problem List:  1. Acute pancreatitis 2. Hypertrophic Obstructive Cardiomyopathy 3. Chronic Diastolic CHF (Grade 2 Diastolic Dysfunction) 2/2 HOCM  Disposition: home  Discharge Condition: good  Discharge Exam: General: Thin female lying comfortably in bed. Cardiovascular: systolic ejection murmur 4/6 present, regular rate and rhythm; no LE edema  Respiratory: CTAB, no crackles, rhonci, or wheezes Abdomen: mildly tender to deep palpation in R & L UQ, more in RUQ, BS present, abdomen mildly protuberant, no guarding or rebound tenderness Extremities: no LE edema, or wounds. Mild excoriation rash on bilateral upper extremities 2/2 scratching with pt allergy to hydromorphone.  Brief Hospital Course:  Pt arrived to ED with concerns for abrupt increase in swelling in both bilateral lower extremities and abdomen, along with severe upper abdominal pain. Clinically consistent with acute pancreatitis with elevated lipase 265, upper abd pain, confirmed on imaging with Abd CT, unclear exact etiology, suspect may be pancreatic duct edema (from chronic dCHF with some abd / mesenteric edema) vs passed gallstone (however none identified in gallbladder or in duct on RUQ Abd [place]), without alcohol consumption recently. Additionally with dilated extrahepatic biliary duct on imaging, not clinically consistent with cholangitis or cholecystitis (negative on imaging), concern with elevated alk phos and T.Bili 1.9, suggestive of biliary etiology (but not clearly identified).  Triglycerides elevated on admission, possible lab error since resolved as well as on repeat lab. Ranson Score 1 on admit (0.9% mortality), APACHE-II score 4 (not considered severe). GI consult performed ERCP to rule out radiolucent gallstones. Found dilated mid to proximal CBD, pancreatitis, normal gallbladder. Pt tolerated procedure well, pain was decreased after procedure and hydration. Pt actively requested to go home immediately after procedure, but was observed for ability to tolerate PO and pain control for evening of 9/23.   Issues for Follow Up:  1. Pt needs GI follow up in 2-4 weeks as an outpatient. 2. Continue regular cardiology care for HOCM and pending surgery in November.  Significant Procedures: ERCP 9/23 - dilated mid to proximal CBD, pancreatitis, normal gallbladder   Significant Labs and Imaging:   Recent Labs Lab 04/13/15 0500 04/14/15 0245  WBC 9.0 10.0  HGB 15.4* 13.6  HCT 44.7 39.7  PLT 263 203    Recent Labs Lab 04/13/15 0500 04/14/15 0245 04/14/15 1100  NA 134* 135  --   K 3.3* 3.0*  --   CL 95* 98*  --   CO2 23 30  --   GLUCOSE 116* 96  --   BUN <5* <5*  --   CREATININE 0.57 0.61  --   CALCIUM 9.2 8.1*  --   MG  --   --  1.3*  ALKPHOS 186* 148*  --   AST 159* 100*  --   ALT 74* 47  --   ALBUMIN 4.1 3.2*  --    Triglycerides : 1254 > 175 > 184   Results/Tests Pending at Time of Discharge: none  Discharge Medications:    Medication List    TAKE these medications        diphenhydrAMINE 25 MG tablet  Commonly known as:  BENADRYL  Take 25 mg by mouth every 6 (six)  hours as needed.     docusate sodium 100 MG capsule  Commonly known as:  COLACE  Take 1 capsule (100 mg total) by mouth 3 times/day as needed-between meals & bedtime for mild constipation.     furosemide 20 MG tablet  Commonly known as:  LASIX  Take 1 tablet (20 mg total) by mouth daily as needed for fluid or edema (only).     meclizine 25 MG tablet  Commonly known as:   ANTIVERT  Take 1 tablet (25 mg total) by mouth 3 (three) times daily as needed for dizziness.     metoprolol tartrate 25 MG tablet  Commonly known as:  LOPRESSOR  Take 2 tablets (50 mg total) by mouth 3 (three) times daily.     ondansetron 4 MG tablet  Commonly known as:  ZOFRAN  Take 1 tablet (4 mg total) by mouth every 6 (six) hours as needed for nausea.     verapamil 40 MG tablet  Commonly known as:  CALAN  Take 1 tablet (40 mg total) by mouth 3 (three) times daily.        Discharge Instructions: Please refer to Patient Instructions section of EMR for full details.  Patient was counseled important signs and symptoms that should prompt return to medical care, changes in medications, dietary instructions, activity restrictions, and follow up appointments.   Follow-Up Appointments:     Follow-up Information    Follow up with Robyn Haber, MD. Schedule an appointment as soon as possible for a visit in 1 week.   Specialty:  Family Medicine   Why:  For Hospital Followup   Contact information:   [place] [address] [phone]       Follow up with MANN,JYOTHI, MD. Schedule an appointment as soon as possible for a visit in 2 weeks.   Specialty:  Gastroenterology   Why:  For Hospital Followup; in 2-4 weeks   Contact information:   [address] [place] [address] [phone]       Kathryn Timberlake, Med Student 04/17/2015, 3:14 PM Medical student, [facility]  RESIDENT ADDENDUM  I have separately seen and examined the patient. I have discussed the findings and exam with the medical student, helped develop the management plan that is described in the note above. I have amended and agree with the content.   Ryan B. Bonner Puna, MD, PGY-3 04/17/2015 6:11 PM

## 2015-04-18 ENCOUNTER — Encounter (HOSPITAL_COMMUNITY): Payer: Self-pay | Admitting: Gastroenterology

## 2015-04-18 ENCOUNTER — Telehealth: Payer: Self-pay

## 2015-04-18 LAB — CULTURE, BLOOD (ROUTINE X 2)
CULTURE: NO GROWTH
CULTURE: NO GROWTH

## 2015-04-18 NOTE — Telephone Encounter (Signed)
° °  Please schedule a hospital f/u with Dr. Katrinka Blazing. Next week.  [phone]

## 2015-04-21 DIAGNOSIS — K831 Obstruction of bile duct: Secondary | ICD-10-CM | POA: Insufficient documentation

## 2015-05-01 ENCOUNTER — Telehealth: Payer: Self-pay

## 2015-05-01 DIAGNOSIS — Z0000 Encounter for general adult medical examination without abnormal findings: Secondary | ICD-10-CM

## 2015-05-01 NOTE — Telephone Encounter (Signed)
The patient called to request a referral to Dr Kenna Gilbert office for

## 2015-05-02 NOTE — Telephone Encounter (Signed)
The patient called to request a referral to Dr [facility] office for a gastroenterology referral.  Please advise, thank you.  CB#: [phone]

## 2015-05-03 NOTE — Addendum Note (Signed)
Addended by: Elvina SidleLAUENSTEIN, Veora Fonte on: 05/03/2015 09:28 PM   Modules accepted: Orders

## 2015-06-20 HISTORY — PX: OTHER SURGICAL HISTORY: SHX169

## 2015-07-07 ENCOUNTER — Telehealth: Payer: Self-pay | Admitting: *Deleted

## 2015-07-07 NOTE — Telephone Encounter (Signed)
Signed order for cardiac rehab faxed to [facility]. 

## 2015-07-12 ENCOUNTER — Telehealth: Payer: Self-pay

## 2015-07-12 NOTE — Telephone Encounter (Signed)
Patient is calling because she wants a hospital follow up appointment with Dr. Katrinka BlazingSmith at the beginning of January and is very upset there isn't anything available. [facility]. Patient would only like to see. Dr. Katrinka BlazingSmith. Also, Patient would like [facility] and also a referral to see. Dr. Jomarie Longsroituru on [address] She would also like to know if Dr. Katrinka BlazingSmith receive any faxed information from her surgery.  Patient states that she expects to hear something back from Dr. Katrinka BlazingSmith.

## 2015-07-18 NOTE — Telephone Encounter (Signed)
LMVM for patient stating that she has an appointment with Dr. Katrinka BlazingSmith on 07/24/15 at 11:30 am and will place the referrals at that time.  Please call back with any questions.

## 2015-07-18 NOTE — Telephone Encounter (Signed)
Call -- I have scheduled patient with me on Monday, 07/24/15 at 11:30am.  We will place referrals at that appointment.

## 2015-07-24 ENCOUNTER — Ambulatory Visit (INDEPENDENT_AMBULATORY_CARE_PROVIDER_SITE_OTHER): Payer: No Typology Code available for payment source | Admitting: Family Medicine

## 2015-07-24 ENCOUNTER — Encounter: Payer: Self-pay | Admitting: Family Medicine

## 2015-07-24 VITALS — BP 118/74 | HR 70 | Temp 98.3°F | Resp 16 | Ht 62.0 in | Wt 119.0 lb

## 2015-07-24 DIAGNOSIS — I421 Obstructive hypertrophic cardiomyopathy: Secondary | ICD-10-CM | POA: Diagnosis not present

## 2015-07-24 DIAGNOSIS — D62 Acute posthemorrhagic anemia: Secondary | ICD-10-CM | POA: Diagnosis not present

## 2015-07-24 DIAGNOSIS — Z9889 Other specified postprocedural states: Secondary | ICD-10-CM

## 2015-07-24 NOTE — Progress Notes (Addendum)
Subjective:    Patient ID: Erin Berry, female    DOB: 01/08/1961, 55 y.o.   MRN: [id number]  07/24/2015  Heart Problem and Medical Clearance   HPI This 55 y.o. female presents for HOSPITAL FOLLOW-UP:  Discharged 06-26-15.   [facility] contacted [facility] to start cardiac rehab.  Needs referral to cardiac rehab.   [facility] contacted Dr. Royann Shivers for cardiology refill. Needs clearance to drive; needs clearance to lift more than 8 pounds.  Cannot get an appointment with cardiology.   Cannot put anything on midline substernal incision; can only apply Dial soap.  Not cleared to apply lotions or scar vanishing cream.   Patient has tried to contact cardiology 07-13-15.  Left message and got very angry because appointment offered in 08-24-15.  Tried to schedule at last appointment but computer system would not schedule appointment in first calendar month of 2017.  Medication has changed.   Very nice cardiologist; getting a call back is very difficult; only in office on Thursdays.    Insomnia: not sleeping well at all.  [facility] took away all PM products because they interact with B blockers.    Only taking Metoprolol 25mg  -- two tablets bid. Aleve PRN. Tramadol but [facility] will not refill it; only taking at night.   Also prescribed Dilaudid; but only takes at night.  Still having pain with turning over in bed.   Has tried Tramadol at bedtime but still has insomnia.  Still has Feels amazing; no longer having orthopnea or DOE or chest tightness at nighttime.  Breathing is significantly improved.  Having a lot of soreness due to sternotomy.  Had two different revisions during procedure; feeling great.  No dizziness.  Edema is gone.    Details of discharge summary:   DIAGNOSIS:  1. Hypertrophic obstructive cardiomyopathy. 2. Previous attempt at alcohol septal ablation.  SURGICAL PROCEDURE:  1.  Median sternotomy 2. Extended L ventricular septal myectomy.  HOSPITAL COURSE:    Transferred to cardiac ICU following procedure.  Gradually weaned off of inotropic therapy.  Transferred to Progressive Care Unit on 11/30.  On 12/3 underwent dismissal echo which revealed a small to moderate circumferential pericardial effusion without tamponade.  Repeat performed on 12/5 with stable pericardial effusion.    DISCHARGE LABS:  06/26/15:  Hgb 9.5.  WBC 8.1. PLT 313.  Na 136.  K 3.8. Cr 0.5 BUNC 5  CXR on 06/24/15:  Tiny B pleural effusions present; slight increase in atx L midlung and B bases.  Mild CM.    EKG on 06/24/15: NSR; L atrial enlargement; low anterior forces; non-specific intra-ventricular conduction block; T wave abnormality, consider inferior infarct.  No change from 06-20-15 EKG.  Echo 06/26/15:  Hypertrophic CM; extended L ventricular septal myomectomy.  Normal LF chamber size; EF 77%; mid L ventricular maximal gradient 36mm Hg.  Mild decrease in R ventricular systolic function.  R ventricular systolic pressure 36 mm Hg. (systolic BP 120 mm Hg).  Trivial aortic valve regurg; mild tricuspid valve regurg.  Mild VM regurg posteriorly directed eccentric jet.  Small to moderate circumferential pericardial effusion without tamponade features.  Review of Systems  Constitutional: Negative for fever, chills, diaphoresis and fatigue.  Eyes: Negative for visual disturbance.  Respiratory: Negative for cough and shortness of breath.   Cardiovascular: Negative for chest pain, palpitations and leg swelling.  Gastrointestinal: Negative for nausea, vomiting, abdominal pain, diarrhea and constipation.  Endocrine: Negative for cold intolerance, heat intolerance, polydipsia, polyphagia and polyuria.  Neurological:  Negative for dizziness, tremors, seizures, syncope, facial asymmetry, speech difficulty, weakness, light-headedness, numbness and headaches.    Past Medical History  Diagnosis Date  . Cardiomyopathy (HCC)   . Hypertrophic obstructive cardiomyopathy (HOCM) (HCC) 12/02/2014     with L sided diastolic heart failure, moderate mitral valve regurg; s/p extended L ventricular septal myomectomy 06/20/15 [facility]  . Vertigo   . Migraine    Past Surgical History  Procedure Laterality Date  . Appendectomy    . Cesarean section    . Eus N/A 04/14/2015    Procedure: ESOPHAGEAL ENDOSCOPIC ULTRASOUND (EUS) RADIAL;  Surgeon: Jeani HawkingPatrick Hung, MD;  Location: [facility];  Service: Endoscopy;  Laterality: N/A;  . Left ventricular septal myomectomy  06/20/15    for hypertrophic obstructive cardiomyopathy.  [facility].   Allergies  Allergen Reactions  . Wellbutrin [Bupropion] Anaphylaxis  . Hydromorphone Itching  . Morphine And Related Other (See Comments)    Inside and out of body felt like it was on fire  . Sulfur Hives  . Penicillins Hives   Current Outpatient Prescriptions  Medication Sig Dispense Refill  . lidocaine (LIDODERM) 5 %     . metoprolol tartrate (LOPRESSOR) 25 MG tablet Take 2 tablets (50 mg total) by mouth 3 (three) times daily. (Patient taking differently: Take 50 mg by mouth 2 (two) times daily. ) 180 tablet 6  . docusate sodium (COLACE) 100 MG capsule Take 1 capsule (100 mg total) by mouth 3 times/day as needed-between meals & bedtime for mild constipation. (Patient not taking: Reported on 07/24/2015) 10 capsule 0  . HYDROmorphone (DILAUDID) 2 MG tablet Reported on 07/25/2015    . metoprolol (LOPRESSOR) 50 MG tablet     . mupirocin ointment (BACTROBAN) 2 %     . traMADol (ULTRAM) 50 MG tablet      No current facility-administered medications for this visit.   Social History   Social History  . Marital Status: Divorced    Spouse Name: N/A  . Number of Children: N/A  . Years of Education: N/A   Occupational History  . Not on file.   Social History Main Topics  . Smoking status: Never Smoker   . Smokeless tobacco: Not on file  . Alcohol Use: 0.0 oz/week    0 Standard drinks or equivalent per week  . Drug Use: No  . Sexual Activity: Not on file     Other Topics Concern  . 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   Family History  Problem Relation Age of Onset  . Adopted: Yes       Objective:    BP 118/74 mmHg  Pulse 70  Temp(Src) 98.3 F (36.8 C)  Resp 16  Ht 5\' 2"  (1.575 m)  Wt 119 lb (53.978 kg)  BMI 21.76 kg/m2  SpO2 100% Physical Exam  Constitutional: She is oriented to person, place, and time. She appears well-developed and well-nourished. No distress.  HENT:  Head: Normocephalic and atraumatic.  Right Ear: External ear normal.  Left Ear: External ear normal.  Nose: Nose normal.  Mouth/Throat: Oropharynx is clear and moist.  Eyes: Conjunctivae and EOM are  normal. Pupils are equal, round, and reactive to light.  Neck: Normal range of motion. Neck supple. Carotid bruit is not present. No thyromegaly present.  Cardiovascular: Normal rate, regular rhythm, normal heart sounds and intact distal pulses.  Exam reveals no gallop and no friction rub.   No murmur heard. Pulmonary/Chest: Effort normal and breath sounds normal. She has no wheezes. She has no rales.  Well-healed midline vertical incision.  Abdominal: Soft. Bowel sounds are normal. She exhibits no distension and no mass. There is no tenderness. There is no rebound and no guarding.  Musculoskeletal: She exhibits no edema.  Lymphadenopathy:    She has no cervical adenopathy.  Neurological: She is alert and oriented to person, place, and time. No cranial nerve deficit.  Skin: Skin is warm and dry. No rash noted. She is not diaphoretic. No erythema. No pallor.  Psychiatric: She has a normal mood and affect. Her behavior is normal.        Assessment & Plan:   1. Hypertrophic obstructive cardiomyopathy (HCC)   2. S/P ventricular septal  myectomy   3. Postoperative anemia due to acute blood loss    -doing very well post-operatively.  -continues to suffer with B chest wall pain/soreness. -will help coordinate follow-up with cardiology this month. -warrants cardiology clearance prior to initiating cardiac rehab. -now only maintained on Metoprolol 50mg  bid. -obtain labs; pt refused labs today but desires to return later this week for repeat labs. -suffered with post-op pericardial effusion without tamponade; asymptomatic today.    No orders of the defined types were placed in this encounter.   Meds ordered this encounter  Medications  . HYDROmorphone (DILAUDID) 2 MG tablet    Sig: Reported on 07/25/2015  . lidocaine (LIDODERM) 5 %    Sig:   . metoprolol (LOPRESSOR) 50 MG tablet    Sig:   . mupirocin ointment (BACTROBAN) 2 %    Sig:   . traMADol (ULTRAM) 50 MG tablet    Sig:     Return in about 6 months (around 01/21/2016) for complete physical examiniation.    Tremaine Fuhriman Paulita Fujita, M.D. [facility] [address] [place], [place]  [address] [phone] phone [phone] fax

## 2015-07-25 ENCOUNTER — Other Ambulatory Visit: Payer: Self-pay | Admitting: Family Medicine

## 2015-07-25 ENCOUNTER — Encounter: Payer: Self-pay | Admitting: Family Medicine

## 2015-07-25 DIAGNOSIS — D62 Acute posthemorrhagic anemia: Secondary | ICD-10-CM

## 2015-07-25 DIAGNOSIS — I421 Obstructive hypertrophic cardiomyopathy: Secondary | ICD-10-CM

## 2015-07-25 DIAGNOSIS — Z9889 Other specified postprocedural states: Secondary | ICD-10-CM | POA: Insufficient documentation

## 2015-07-26 ENCOUNTER — Encounter: Payer: Self-pay | Admitting: Cardiovascular Disease

## 2015-07-26 ENCOUNTER — Ambulatory Visit (INDEPENDENT_AMBULATORY_CARE_PROVIDER_SITE_OTHER): Payer: BLUE CROSS/BLUE SHIELD | Admitting: Cardiovascular Disease

## 2015-07-26 VITALS — BP 120/94 | HR 77 | Resp 16 | Ht 62.0 in | Wt 120.1 lb

## 2015-07-26 DIAGNOSIS — I421 Obstructive hypertrophic cardiomyopathy: Secondary | ICD-10-CM | POA: Diagnosis not present

## 2015-07-26 NOTE — Patient Instructions (Addendum)
Dr Royann Shiversroitoru has made no changes today in your current medications or treatment plan.  Your physician has requested that you have an echocardiogram. Echocardiography is a painless test that uses sound waves to create images of your heart. It provides your doctor with information about the size and shape of your heart and how well your heart's chambers and valves are working. This procedure takes approximately one hour. There are no restrictions for this procedure.  Your physician recommends that you schedule a follow-up appointment in 3 months.  If you need a refill on your cardiac medications before your next appointment, please call your pharmacy.

## 2015-07-28 NOTE — Progress Notes (Signed)
Patient ID: Erin Berry Liles, female   DOB: Jun 30, 1961, 55 y.o.   MRN: [id number]     Cardiology Office Note    Date:  07/29/2015   ID:  Erin Berry Stonerock, DOB Jun 30, 1961, MRN [id number]  PCP:  Nilda SimmerSMITH,KRISTI, MD  Cardiologist:   Thurmon FairROITORU,Sylvester Salonga, MD   Chief Complaint  Patient presents with  . Follow-up    no chest pain, no shortness of breath, no edema, no pain or cramping in legs, no lightheaded or dizzinesss    History of Present Illness:  Erin Berry Leisure is a 55 y.o. female with HOCM with markedly improved symptoms [facility], tolerated without major complications. Postop echo (while still anemic/hyperdynamic) showed LVOT gradient was down to 36 mm Hg. She continues to have a lot of surgical discomfort, Dilaudid has caused constipation. She has a lot of insomnia and was told not to take diphenhydramine due to the beta blocker interaction (but she has been on much higher doses of metoprolol in the past). BP is a little high, maybe due to pain and use of Aleve. She is eager to start cardiac rehab.    Past Medical History  Diagnosis Date  . Cardiomyopathy (HCC)   . Hypertrophic obstructive cardiomyopathy (HOCM) (HCC) 12/02/2014    with L sided diastolic heart failure, moderate mitral valve regurg; s/p extended L ventricular septal myomectomy 06/20/15 [facility]  . Vertigo   . Migraine     Past Surgical History  Procedure Laterality Date  . Appendectomy    . Cesarean section    . Eus N/A 04/14/2015    Procedure: ESOPHAGEAL ENDOSCOPIC ULTRASOUND (EUS) RADIAL;  Surgeon: Jeani HawkingPatrick Hung, MD;  Location: [facility];  Service: Endoscopy;  Laterality: N/A;  . Left ventricular septal myomectomy  06/20/15    for hypertrophic obstructive cardiomyopathy.  [facility].    Current Outpatient Prescriptions  Medication Sig Dispense Refill  . docusate sodium (COLACE) 100 MG capsule Take 1 capsule (100 mg total) by mouth 3 times/day as needed-between  meals & bedtime for mild constipation. 10 capsule 0  . HYDROmorphone (DILAUDID) 2 MG tablet Reported on 07/25/2015    . lidocaine (LIDODERM) 5 %     . metoprolol (LOPRESSOR) 50 MG tablet     . mupirocin ointment (BACTROBAN) 2 %     . traMADol (ULTRAM) 50 MG tablet      No current facility-administered medications for this visit.    Allergies:   Wellbutrin; Oxycodone; Hydromorphone; Morphine and related; Sulfur; and Penicillins   Social History   Social History  . Marital Status: Divorced    Spouse Name: N/A  . Number of Children: N/A  . Years of Education: N/A   Social History Main Topics  . Smoking status: Never Smoker   . Smokeless tobacco: None  . Alcohol Use: 0.0 oz/week    0 Standard drinks or equivalent per week  . Drug Use: No  . Sexual Activity: Not Asked   Other Topics Concern  . None   Social History Narrative   Marital status: divorced;       Children: 1 daughter; none      Lives: alone      Employment:  Chief Financial OfficerMarketing; moved from [place]      Tobacco: quit in college      Alcohol:  Weekends; wine white      Exercise:  Light exercise.     Family History:  The patient's family history is not known. She was adopted.  ROS:   Please see the history of present illness.    ROS All other systems reviewed and are negative.   PHYSICAL EXAM:   VS:  BP 120/94 mmHg  Pulse 77  Ht 5\' 2"  (1.575 m)  Wt 120 lb 1 oz (54.46 kg)  BMI 21.95 kg/m2   GEN: Well nourished, well developed, in no acute distress HEENT: normal Neck: no JVD, carotid bruits, or masses Cardiac: RRR; very faint, brief early systolic murmur; no diastolic murmurs, rubs, or gallops,no edema  Respiratory:  clear to auscultation bilaterally, normal work of breathing GI: soft, nontender, nondistended, + BS MS: no deformity or atrophy Skin: warm and dry, no rash Neuro:  Alert and Oriented x 3, Strength and sensation are intact Psych: euthymic mood, full affect  Wt Readings from Last 3 Encounters:  07/26/15  120 lb 1 oz (54.46 kg)  07/24/15 119 lb (53.978 kg)  04/15/15 123 lb 14.4 oz (56.2 kg)      Studies/Labs Reviewed:   EKG:  EKG is ordered today.  The ekg ordered today demonstrates NSR, LA enlargement, nonspecific IVCD, QRS 126 ms, QTC 500 ms. T wave inversion is seen in leads I, aVL, V4-V6  Recent Labs: 04/13/2015: B Natriuretic Peptide 103.7* 04/14/2015: ALT 47; BUN <5*; Creatinine, Ser 0.61; Hemoglobin 13.6; Magnesium 1.3*; Platelets 203; Potassium 3.0*; Sodium 135   Lipid Panel    Component Value Date/Time   CHOL 218* 04/13/2015 0500   TRIG 187* 04/14/2015 0245   HDL NOT REPORTED DUE TO HIGH TRIGLYCERIDES 04/13/2015 0500   CHOLHDL NOT REPORTED DUE TO HIGH TRIGLYCERIDES 04/13/2015 0500   VLDL UNABLE TO CALCULATE IF TRIGLYCERIDE OVER 400 mg/dL 16/04/9603 5409   LDLCALC UNABLE TO CALCULATE IF TRIGLYCERIDE OVER 400 mg/dL 81/19/1478 2956    Additional studies/ records that were reviewed today include:  Records from [facility]    ASSESSMENT:    1. HOCM (hypertrophic obstructive cardiomyopathy) (HCC)      PLAN:  In order of problems listed above:  1. S/p uncomplicated septal myectomy, without arrhythmia or evidence for VSD by exam and postop echo. I think she can take a diphenhydramine sleep aid at bedtime despite the interaction with her low dose of beta blocker - we may try to wean this a little if her BP normalizes and she remains arrhythmia free. Reevaluate gradient about 6 weeks postop, closer to baseline conditions. Very pleased with her improved functional status, but cautioned that she is still at long term risk of CHF, AFib and VT.   Medication Adjustments/Labs and Tests Ordered: Current medicines are reviewed at length with the patient today.  Concerns regarding medicines are outlined above.  Medication changes, Labs and Tests ordered today are listed below. Patient Instructions  Dr Royann Shivers has made no changes today in your current medications or treatment  plan.  Your physician has requested that you have an echocardiogram. Echocardiography is a painless test that uses sound waves to create images of your heart. It provides your doctor with information about the size and shape of your heart and how well your heart's chambers and valves are working. This procedure takes approximately one hour. There are no restrictions for this procedure.  Your physician recommends that you schedule a follow-up appointment in 3 months.  If you need a refill on your cardiac medications before your next appointment, please call your pharmacy.     Joie Bimler, MD  07/29/2015 11:41 AM    [facility] [address] [place],  [place], [place]  [address] Phone: [phone]; Fax: [phone]

## 2015-07-29 ENCOUNTER — Encounter: Payer: Self-pay | Admitting: Cardiovascular Disease

## 2015-08-03 ENCOUNTER — Encounter (HOSPITAL_COMMUNITY)
Admission: RE | Admit: 2015-08-03 | Discharge: 2015-08-03 | Disposition: A | Discharge status: Home / Self Care | Payer: BLUE CROSS/BLUE SHIELD | Source: Ambulatory Visit | Attending: Cardiovascular Disease | Admitting: Cardiovascular Disease

## 2015-08-03 DIAGNOSIS — Z48812 Encounter for surgical aftercare following surgery on the circulatory system: Secondary | ICD-10-CM | POA: Insufficient documentation

## 2015-08-03 NOTE — Progress Notes (Signed)
Cardiac Rehab Medication Review by a Pharmacist  Does the patient  feel that his/her medications are working for him/her?  yes  Has the patient been experiencing any side effects to the medications prescribed?  yes  Does the patient measure his/her own blood pressure or blood glucose at home?  no   Does the patient have any problems obtaining medications due to transportation or finances?   no  Understanding of regimen: good Understanding of indications: good Potential of compliance: good    Pharmacist comments: 55 y/o female presents to cardiac rehab in good spirits and no distress. Review patients medication list and no issues were noted. Metoprolol sometimes upsets the pts stomach but she can manage that with OTC medication. Pt does not monitor her BP so provide counseling on the importance of this.    Sandi CarneNick Huda Petrey, PharmD Pharmacy Resident Pager: [phone] 08/03/2015 8:28 AM

## 2015-08-07 ENCOUNTER — Encounter (HOSPITAL_COMMUNITY): Payer: Self-pay

## 2015-08-07 ENCOUNTER — Encounter (HOSPITAL_COMMUNITY)
Admission: RE | Admit: 2015-08-07 | Discharge: 2015-08-07 | Disposition: A | Discharge status: Home / Self Care | Payer: BLUE CROSS/BLUE SHIELD | Source: Ambulatory Visit | Attending: Cardiovascular Disease | Admitting: Cardiovascular Disease

## 2015-08-07 DIAGNOSIS — Z48812 Encounter for surgical aftercare following surgery on the circulatory system: Secondary | ICD-10-CM | POA: Diagnosis not present

## 2015-08-07 NOTE — Progress Notes (Signed)
Pt started cardiac rehab today.  Pt tolerated light exercise without difficulty. VSS, telemetry-sinus rhythm, intraventricular conduction delay, non specific ST-T wave changes, asymptomatic.  Medication list reconciled.  Pt verbalized compliance with medications and denies barriers to compliance. PSYCHOSOCIAL ASSESSMENT:  PHQ-0. Pt exhibits positive coping skills, hopeful outlook with supportive family. No psychosocial needs identified at this time, no psychosocial interventions necessary.    Pt enjoys international travel.  Pt cardiac rehab  goals are to increase strength and stamina to resume high intensity exercise which was stopped in 2013.   Pt encouraged to participate in home exercise activities in addition to cardiac rehab  to increase ability to achieve these goals.  Pt also cautioned against high intensity workouts at this time, pending physician approval and specific recommendations for her.  Pt oriented to exercise equipment and routine.  Understanding verbalized.

## 2015-08-08 ENCOUNTER — Telehealth (HOSPITAL_COMMUNITY): Payer: Self-pay | Admitting: Cardiac Rehabilitation

## 2015-08-08 NOTE — Telephone Encounter (Signed)
-----   Message from Thurmon Fair, MD sent at 08/08/2015  8:56 AM EST ----- Regarding: RE: cardiac rehab She has undergone septal ablation and has minimal residual obstruction. She would fit into standard parameters at this point  Thurmon Fair, MD  ----- Message -----    From: Robyne Peers, RN    Sent: 08/07/2015   6:43 AM      To: Thurmon Fair, MD Subject: cardiac rehab                                  Dear Dr. Royann Shivers,  Pt is scheduled to begin cardiac rehab.  With her diagnosis of HOCM, what are her blood pressure parameters, resting and exercise?  Are there any activity restrictions?    Thank you, Deveron Furlong, RN, BSN Cardiac Pulmonary Rehab

## 2015-08-09 ENCOUNTER — Encounter (HOSPITAL_COMMUNITY)
Admission: RE | Admit: 2015-08-09 | Discharge: 2015-08-09 | Disposition: A | Discharge status: Home / Self Care | Payer: BLUE CROSS/BLUE SHIELD | Source: Ambulatory Visit | Attending: Cardiovascular Disease | Admitting: Cardiovascular Disease

## 2015-08-09 DIAGNOSIS — Z48812 Encounter for surgical aftercare following surgery on the circulatory system: Secondary | ICD-10-CM | POA: Diagnosis not present

## 2015-08-10 ENCOUNTER — Ambulatory Visit (HOSPITAL_COMMUNITY): Payer: No Typology Code available for payment source | Attending: Cardiovascular Disease

## 2015-08-10 DIAGNOSIS — I340 Nonrheumatic mitral (valve) insufficiency: Secondary | ICD-10-CM | POA: Diagnosis not present

## 2015-08-10 DIAGNOSIS — I421 Obstructive hypertrophic cardiomyopathy: Secondary | ICD-10-CM | POA: Diagnosis not present

## 2015-08-10 DIAGNOSIS — I517 Cardiomegaly: Secondary | ICD-10-CM | POA: Insufficient documentation

## 2015-08-10 DIAGNOSIS — I351 Nonrheumatic aortic (valve) insufficiency: Secondary | ICD-10-CM | POA: Insufficient documentation

## 2015-08-10 DIAGNOSIS — K859 Acute pancreatitis without necrosis or infection, unspecified: Secondary | ICD-10-CM | POA: Diagnosis not present

## 2015-08-10 DIAGNOSIS — I313 Pericardial effusion (noninflammatory): Secondary | ICD-10-CM | POA: Insufficient documentation

## 2015-08-11 ENCOUNTER — Encounter (HOSPITAL_COMMUNITY)
Admission: RE | Admit: 2015-08-11 | Discharge: 2015-08-11 | Disposition: A | Discharge status: Home / Self Care | Payer: BLUE CROSS/BLUE SHIELD | Source: Ambulatory Visit | Attending: Cardiovascular Disease | Admitting: Cardiovascular Disease

## 2015-08-11 DIAGNOSIS — Z48812 Encounter for surgical aftercare following surgery on the circulatory system: Secondary | ICD-10-CM | POA: Diagnosis not present

## 2015-08-11 NOTE — Progress Notes (Signed)
QUALITY OF LIFE SCORE REVIEW  Pt completed Quality of Life survey as a participant in Cardiac Rehab. Scores 21.0 or below are considered low. Pt score very low in several areas Overall 19.8, Health and Function 17, socioeconomic 23, physiological and spiritual 18.8, family 25. Patient quality of life slightly altered by her HOCM diagnosis. Pt has lived in fear of SCD.  Pt physical condition has declined with her reduced energy.  Pt is pleased with the positive results from her surgery and looking forward to improving her strength/stamina.  Pt reports all of her quality of life scores are affected by her health.  Offered emotional support and reassurance.  Will continue to monitor and intervene as necessary.

## 2015-08-14 ENCOUNTER — Encounter (HOSPITAL_COMMUNITY): Payer: BLUE CROSS/BLUE SHIELD

## 2015-08-16 ENCOUNTER — Encounter (HOSPITAL_COMMUNITY)
Admission: RE | Admit: 2015-08-16 | Discharge: 2015-08-16 | Disposition: A | Discharge status: Home / Self Care | Payer: BLUE CROSS/BLUE SHIELD | Source: Ambulatory Visit | Attending: Cardiovascular Disease | Admitting: Cardiovascular Disease

## 2015-08-16 DIAGNOSIS — Z48812 Encounter for surgical aftercare following surgery on the circulatory system: Secondary | ICD-10-CM | POA: Diagnosis not present

## 2015-08-17 ENCOUNTER — Telehealth: Payer: Self-pay | Admitting: Cardiovascular Disease

## 2015-08-17 NOTE — Progress Notes (Signed)
[facility] 08/17/15 :04PM

## 2015-08-17 NOTE — Telephone Encounter (Signed)
New message ° ° ° ° °Returning a call to the nurse °

## 2015-08-18 ENCOUNTER — Encounter (HOSPITAL_COMMUNITY)
Admission: RE | Admit: 2015-08-18 | Discharge: 2015-08-18 | Disposition: A | Discharge status: Home / Self Care | Payer: BLUE CROSS/BLUE SHIELD | Source: Ambulatory Visit | Attending: Cardiovascular Disease | Admitting: Cardiovascular Disease

## 2015-08-18 DIAGNOSIS — Z48812 Encounter for surgical aftercare following surgery on the circulatory system: Secondary | ICD-10-CM | POA: Diagnosis not present

## 2015-08-18 NOTE — Telephone Encounter (Signed)
Follow up     Can leave message on voice mail  - patient verbalize she in meeting all day.

## 2015-08-18 NOTE — Telephone Encounter (Signed)
Echo results given to patient. 

## 2015-08-21 ENCOUNTER — Encounter (HOSPITAL_COMMUNITY): Payer: BLUE CROSS/BLUE SHIELD

## 2015-08-23 ENCOUNTER — Encounter (HOSPITAL_COMMUNITY): Payer: PRIVATE HEALTH INSURANCE

## 2015-08-25 ENCOUNTER — Encounter: Payer: Self-pay | Admitting: Cardiovascular Disease

## 2015-08-25 ENCOUNTER — Telehealth (HOSPITAL_COMMUNITY): Payer: Self-pay | Admitting: *Deleted

## 2015-08-25 ENCOUNTER — Encounter (HOSPITAL_COMMUNITY): Admission: RE | Admit: 2015-08-25 | Payer: PRIVATE HEALTH INSURANCE | Source: Ambulatory Visit

## 2015-08-28 ENCOUNTER — Encounter (HOSPITAL_COMMUNITY): Payer: PRIVATE HEALTH INSURANCE

## 2015-08-28 ENCOUNTER — Telehealth: Payer: Self-pay | Admitting: Cardiovascular Disease

## 2015-08-28 NOTE — Telephone Encounter (Signed)
New message     Talk to nurse about finding another cardiac rehab program that offers hours in evenings and weekends

## 2015-08-29 NOTE — Telephone Encounter (Signed)
Unfortunately, I am not aware of a program that would work well for someone that works full time, unless you can take time off in the middle of the work day. This is unfortunately a common difficulty for our younger surgical patients. The best approach is to get as much out of it as you can (maybe once or twice a week) learn the techniques and apply them independently at home.

## 2015-08-29 NOTE — Telephone Encounter (Signed)
Returned call to patient.She stated she wanted to ask Dr.Croitoru if he would recommend a different exercise program.Stated she works full time and it is hard for her to go to cardiac rehab 2 hours 3 x week.Stated she will speak to cardiac rehab nurse tomorrow about her problem.Message sent to Dr.Croitoru.

## 2015-08-30 ENCOUNTER — Encounter (HOSPITAL_COMMUNITY): Payer: PRIVATE HEALTH INSURANCE

## 2015-08-31 NOTE — Telephone Encounter (Signed)
Returned call to patient no answer.LMTC. 

## 2015-09-01 ENCOUNTER — Encounter (HOSPITAL_COMMUNITY): Payer: PRIVATE HEALTH INSURANCE

## 2015-09-01 NOTE — Telephone Encounter (Signed)
Returned call to patient.No Answer.Left Dr.Croitoru's recommendations on personal voice mail.

## 2015-09-04 ENCOUNTER — Encounter (HOSPITAL_COMMUNITY): Payer: PRIVATE HEALTH INSURANCE

## 2015-09-06 ENCOUNTER — Encounter (HOSPITAL_COMMUNITY)
Admission: RE | Admit: 2015-09-06 | Discharge: 2015-09-06 | Disposition: A | Discharge status: Home / Self Care | Payer: PRIVATE HEALTH INSURANCE | Source: Ambulatory Visit | Attending: Cardiovascular Disease | Admitting: Cardiovascular Disease

## 2015-09-06 DIAGNOSIS — Z48812 Encounter for surgical aftercare following surgery on the circulatory system: Secondary | ICD-10-CM | POA: Insufficient documentation

## 2015-09-08 ENCOUNTER — Encounter (HOSPITAL_COMMUNITY)
Admission: RE | Admit: 2015-09-08 | Discharge: 2015-09-08 | Disposition: A | Discharge status: Home / Self Care | Payer: PRIVATE HEALTH INSURANCE | Source: Ambulatory Visit | Attending: Cardiovascular Disease | Admitting: Cardiovascular Disease

## 2015-09-08 DIAGNOSIS — Z48812 Encounter for surgical aftercare following surgery on the circulatory system: Secondary | ICD-10-CM | POA: Diagnosis not present

## 2015-09-11 ENCOUNTER — Encounter (HOSPITAL_COMMUNITY)
Admission: RE | Admit: 2015-09-11 | Discharge: 2015-09-11 | Disposition: A | Discharge status: Home / Self Care | Payer: PRIVATE HEALTH INSURANCE | Source: Ambulatory Visit | Attending: Cardiovascular Disease | Admitting: Cardiovascular Disease

## 2015-09-11 DIAGNOSIS — Z48812 Encounter for surgical aftercare following surgery on the circulatory system: Secondary | ICD-10-CM | POA: Diagnosis not present

## 2015-09-11 NOTE — Progress Notes (Signed)
Erin Berry 55 y.o. female  Nutrition Note Spoke with pt. Pt declined to complete nutrition survey. Pt declined nutrition literature offered. Will provide nutrition intervention if requested by pt or staff.  Mickle Plumb, M.Ed, RD, LDN, CDE 09/11/2015 3:37 PM

## 2015-09-13 ENCOUNTER — Encounter (HOSPITAL_COMMUNITY)
Admission: RE | Admit: 2015-09-13 | Discharge: 2015-09-13 | Disposition: A | Discharge status: Home / Self Care | Payer: PRIVATE HEALTH INSURANCE | Source: Ambulatory Visit | Attending: Cardiovascular Disease | Admitting: Cardiovascular Disease

## 2015-09-13 DIAGNOSIS — Z48812 Encounter for surgical aftercare following surgery on the circulatory system: Secondary | ICD-10-CM | POA: Diagnosis not present

## 2015-09-15 ENCOUNTER — Encounter (HOSPITAL_COMMUNITY)
Admission: RE | Admit: 2015-09-15 | Discharge: 2015-09-15 | Disposition: A | Discharge status: Home / Self Care | Payer: PRIVATE HEALTH INSURANCE | Source: Ambulatory Visit | Attending: Cardiovascular Disease | Admitting: Cardiovascular Disease

## 2015-09-15 DIAGNOSIS — Z48812 Encounter for surgical aftercare following surgery on the circulatory system: Secondary | ICD-10-CM | POA: Diagnosis not present

## 2015-09-18 ENCOUNTER — Encounter (HOSPITAL_COMMUNITY)
Admission: RE | Admit: 2015-09-18 | Discharge: 2015-09-18 | Disposition: A | Discharge status: Home / Self Care | Payer: PRIVATE HEALTH INSURANCE | Source: Ambulatory Visit | Attending: Cardiovascular Disease | Admitting: Cardiovascular Disease

## 2015-09-18 DIAGNOSIS — Z48812 Encounter for surgical aftercare following surgery on the circulatory system: Secondary | ICD-10-CM | POA: Diagnosis not present

## 2015-09-20 ENCOUNTER — Encounter (HOSPITAL_COMMUNITY)
Admission: RE | Admit: 2015-09-20 | Discharge: 2015-09-20 | Disposition: A | Discharge status: Home / Self Care | Payer: BLUE CROSS/BLUE SHIELD | Source: Ambulatory Visit | Attending: Cardiovascular Disease | Admitting: Cardiovascular Disease

## 2015-09-20 DIAGNOSIS — Z48812 Encounter for surgical aftercare following surgery on the circulatory system: Secondary | ICD-10-CM | POA: Insufficient documentation

## 2015-09-22 ENCOUNTER — Encounter (HOSPITAL_COMMUNITY): Payer: BLUE CROSS/BLUE SHIELD

## 2015-09-25 ENCOUNTER — Encounter (HOSPITAL_COMMUNITY): Payer: BLUE CROSS/BLUE SHIELD

## 2015-09-27 ENCOUNTER — Encounter (HOSPITAL_COMMUNITY): Payer: BLUE CROSS/BLUE SHIELD

## 2015-09-29 ENCOUNTER — Encounter (HOSPITAL_COMMUNITY): Payer: BLUE CROSS/BLUE SHIELD

## 2015-10-02 ENCOUNTER — Encounter (HOSPITAL_COMMUNITY): Payer: BLUE CROSS/BLUE SHIELD

## 2015-10-04 ENCOUNTER — Encounter (HOSPITAL_COMMUNITY): Payer: BLUE CROSS/BLUE SHIELD

## 2015-10-06 ENCOUNTER — Encounter (HOSPITAL_COMMUNITY): Payer: BLUE CROSS/BLUE SHIELD

## 2015-10-09 ENCOUNTER — Encounter (HOSPITAL_COMMUNITY): Payer: BLUE CROSS/BLUE SHIELD

## 2015-10-11 ENCOUNTER — Encounter (HOSPITAL_COMMUNITY): Payer: BLUE CROSS/BLUE SHIELD

## 2015-10-13 ENCOUNTER — Telehealth: Payer: Self-pay | Admitting: Cardiovascular Disease

## 2015-10-13 ENCOUNTER — Encounter (HOSPITAL_COMMUNITY): Payer: BLUE CROSS/BLUE SHIELD

## 2015-10-13 NOTE — Telephone Encounter (Signed)
Left message to call back  

## 2015-10-13 NOTE — Telephone Encounter (Signed)
Mrs. Erin Berry is calling because the dosage of her Metoprolol 25mg  has not been changed to 50mg  . [facility] not [facility] on [address] . Also Mrs.  Habel is wanting a recommendation to a Cardiologist in [place] , she is relocating down there.   Thanks

## 2015-10-16 ENCOUNTER — Encounter (HOSPITAL_COMMUNITY): Payer: BLUE CROSS/BLUE SHIELD

## 2015-10-17 MED ORDER — METOPROLOL TARTRATE 25 MG PO TABS: 25.0000 mg | ORAL_TABLET | Freq: Two times a day (BID) | ORAL | Status: AC

## 2015-10-17 NOTE — Telephone Encounter (Signed)
Spoke to patient  She states she would like a 1- name of cardiologist in [place]  2- medication was not sent [place] as requested ( will call in the  Medication when DR Croitoru makes a decision  3- she would like to change metoprolol- She states that she did not know that metoprolol can cause hair loss. She states she has spoken to many people who have given her this information She states she has been using the medication for 4 years.  Patient states her hair is so thin that she gets blisters on her head when she walks her dog with out a hat. "I REFUSED  TO BE BALD"  Will defer to Dr Amanda CockayneROITOU

## 2015-10-17 NOTE — Telephone Encounter (Signed)
Unfortunately, ALL beta blockers can cause this side effect and beta blockers are an important treatment for her cardiomyopathy. However, after her successful surgery, I do think that we can reduce the dose gradually and see if that helps. Reduce to 25 mg twice daily. Sotalol is not an appropriate drug for her condition at this time and has more serious side effects.  I don't personally know a Cardiologist in [place] that has a particular interest in HOCM, but Dr. Gareth MorganPatrick Anonick and Dr Rhea BeltonAsheesh Patel are part of their cardiomyopathy team.

## 2015-10-17 NOTE — Telephone Encounter (Signed)
SPOKE TO PATIENT   INFORMATION GIVEN  PATIENT SEEM TO BE PLEASE BY THE INFORMATION   [facility] WITH 6 REFILLS - PATIENT REQUEST

## 2015-10-17 NOTE — Telephone Encounter (Signed)
Pt returning Melinda's call, pls call [phone]

## 2015-10-18 ENCOUNTER — Encounter (HOSPITAL_COMMUNITY): Payer: BLUE CROSS/BLUE SHIELD

## 2015-10-20 ENCOUNTER — Encounter (HOSPITAL_COMMUNITY): Payer: BLUE CROSS/BLUE SHIELD

## 2015-10-23 ENCOUNTER — Encounter (HOSPITAL_COMMUNITY): Payer: BLUE CROSS/BLUE SHIELD

## 2015-10-25 ENCOUNTER — Encounter (HOSPITAL_COMMUNITY): Payer: BLUE CROSS/BLUE SHIELD

## 2015-10-27 ENCOUNTER — Encounter (HOSPITAL_COMMUNITY): Payer: BLUE CROSS/BLUE SHIELD

## 2015-10-30 ENCOUNTER — Encounter (HOSPITAL_COMMUNITY): Payer: BLUE CROSS/BLUE SHIELD

## 2015-11-01 ENCOUNTER — Encounter (HOSPITAL_COMMUNITY): Payer: BLUE CROSS/BLUE SHIELD

## 2015-11-03 ENCOUNTER — Encounter (HOSPITAL_COMMUNITY): Payer: BLUE CROSS/BLUE SHIELD

## 2015-11-06 ENCOUNTER — Encounter (HOSPITAL_COMMUNITY): Payer: BLUE CROSS/BLUE SHIELD

## 2015-11-08 ENCOUNTER — Encounter (HOSPITAL_COMMUNITY): Payer: BLUE CROSS/BLUE SHIELD

## 2015-11-10 ENCOUNTER — Encounter (HOSPITAL_COMMUNITY): Payer: BLUE CROSS/BLUE SHIELD

## 2015-11-17 ENCOUNTER — Encounter: Payer: Self-pay | Admitting: Cardiovascular Disease

## 2016-02-02 IMAGING — US US ABDOMEN LIMITED
1 series · 14 of 25 positions shown · non-contrast
Comparison: CT 04/13/2015.

CLINICAL DATA: Right upper quadrant abdominal pain for 1 day.
Initial encounter.

EXAM:
US ABDOMEN LIMITED - RIGHT UPPER QUADRANT

[Series 1: us abdomen limited · 0.20mm/px · 14 of 56 slices shown]
[im 1/56]
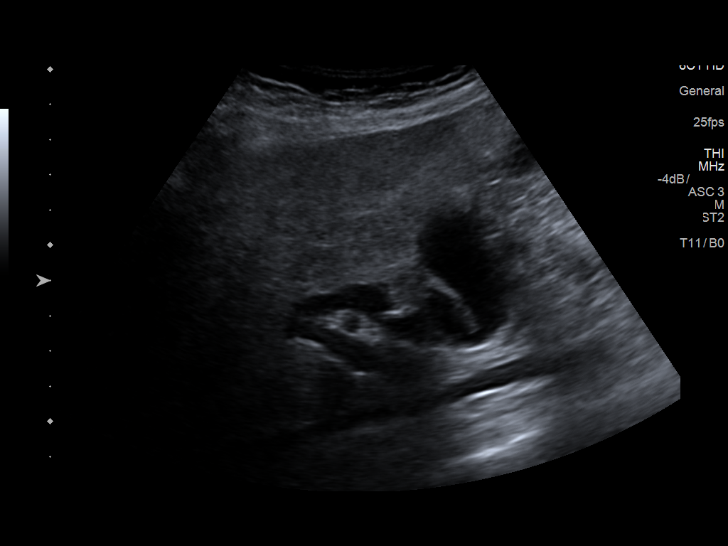
[im 5/56]
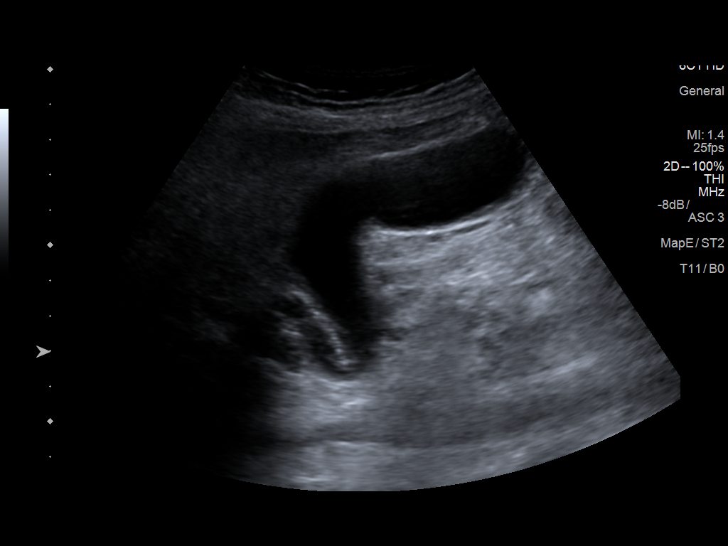
[im 10/56]
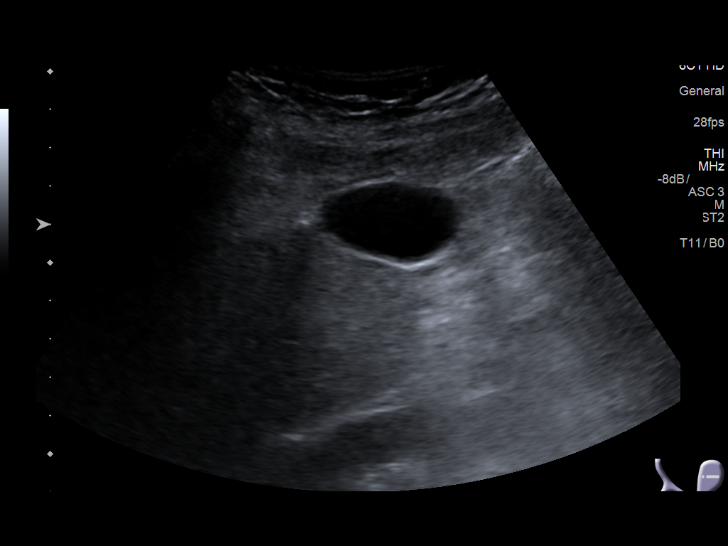
[im 14/56]
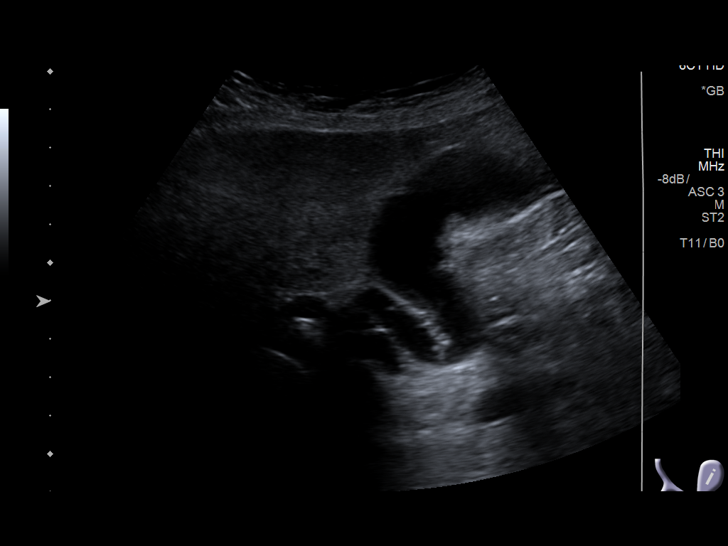
[im 19/56]
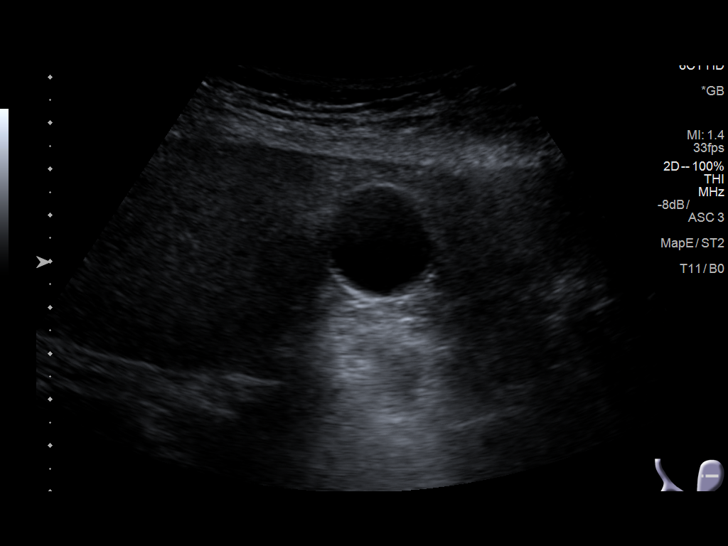
[im 21/56]
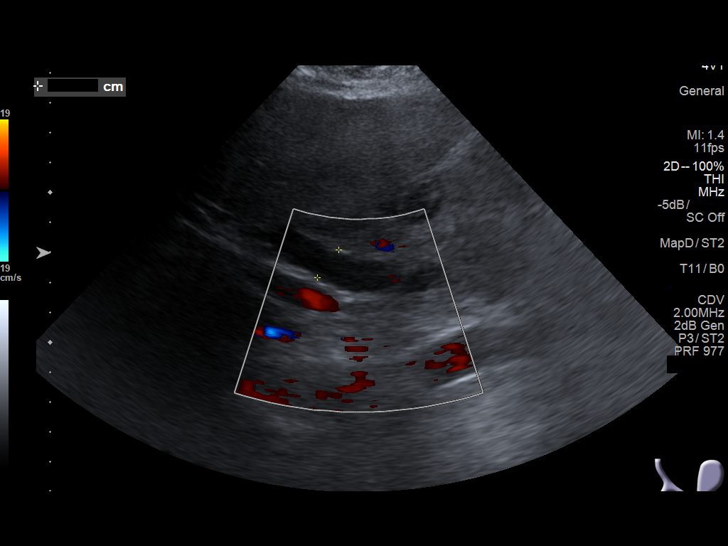
[im 26/56]
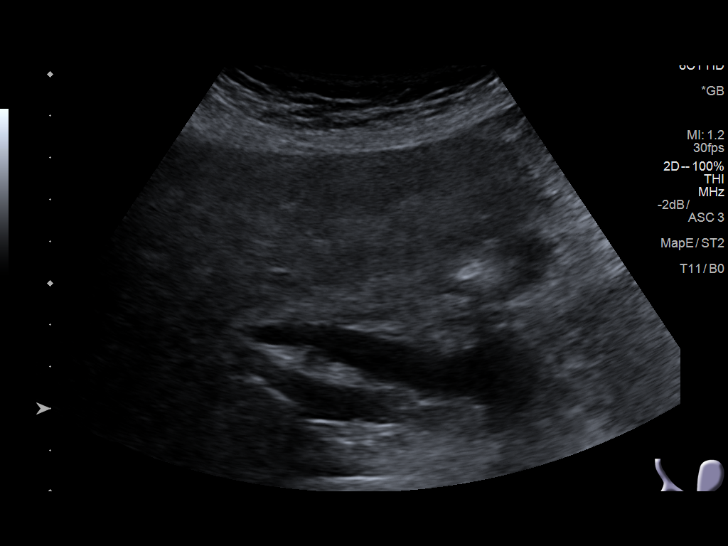
[im 30/56]
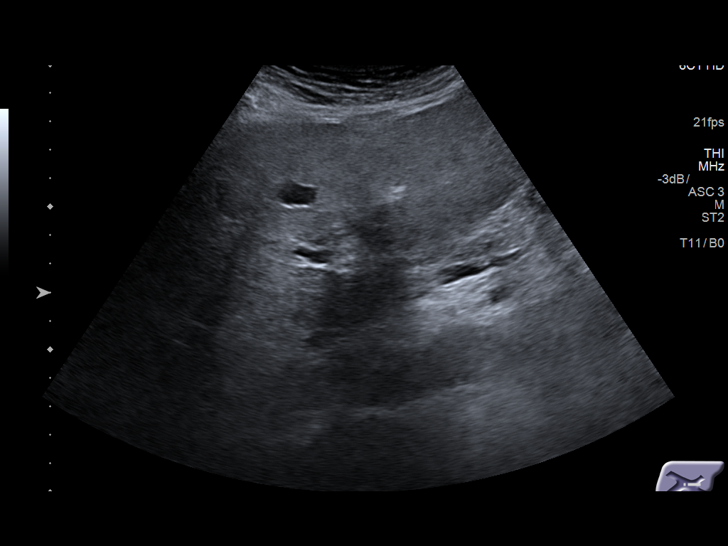
[im 35/56]
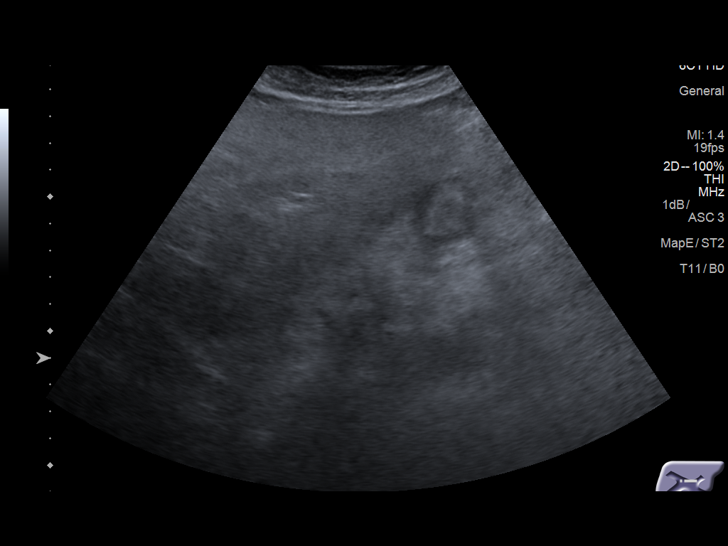
[im 37/56]
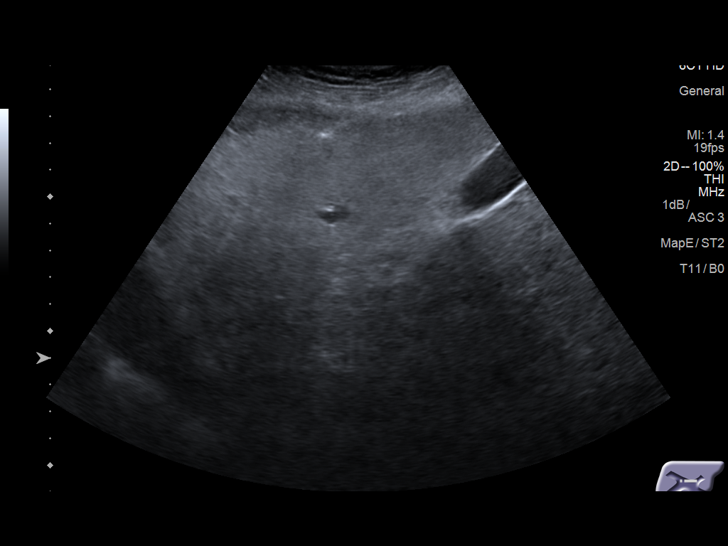
[im 42/56]
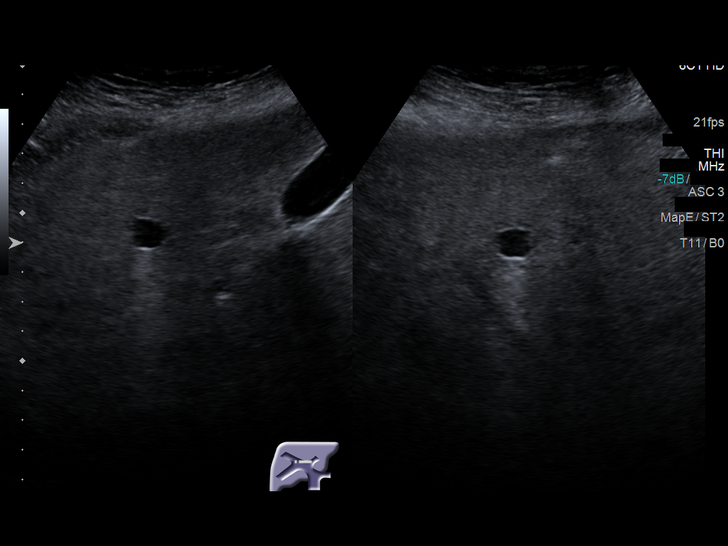
[im 46/56]
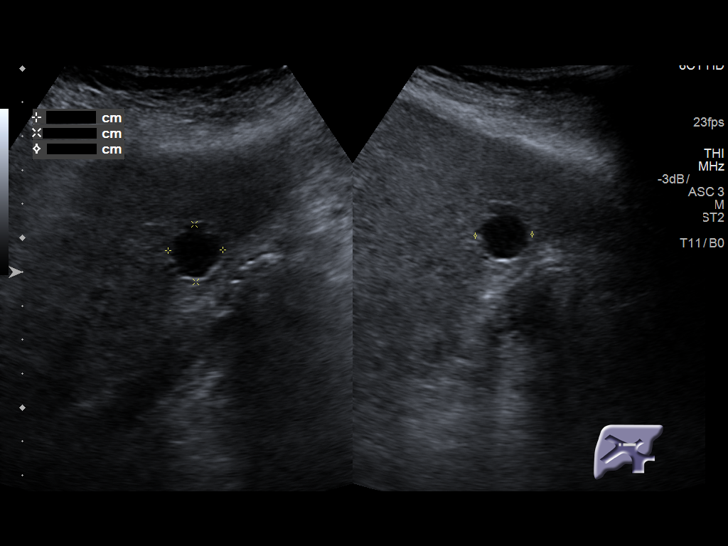
[im 51/56]
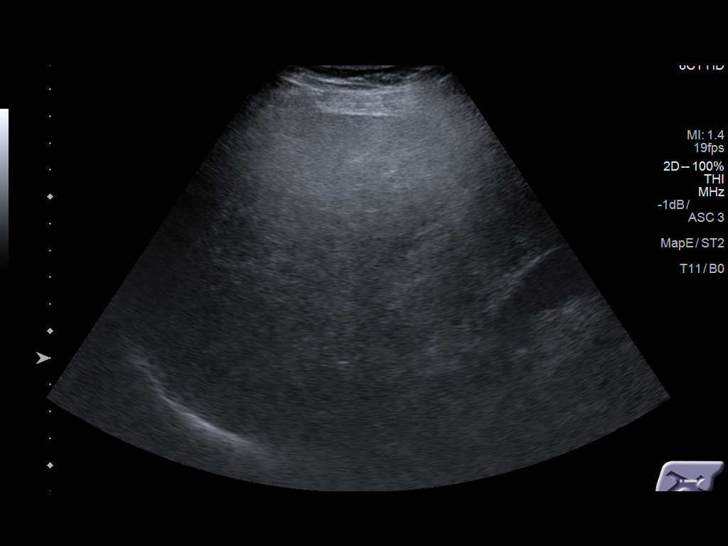
[im 56/56]
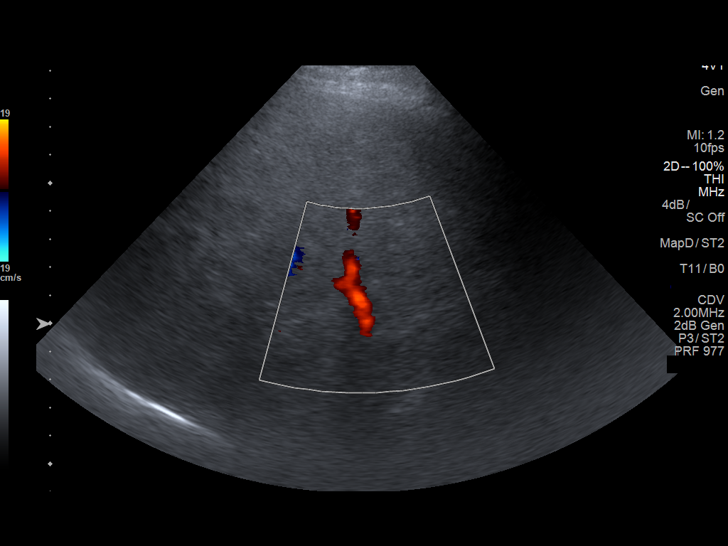

[14 of 25 positions shown; findings below may reference images not displayed]

FINDINGS: Gallbladder:

No gallstones or wall thickening visualized. No sonographic Murphy
sign noted.

Common bile duct:

Diameter: 12 mm.  No intraductal calculi visualized.

Liver:

The liver is heterogeneously echogenic, corresponding with severe
steatosis on CT. Two simple cysts are noted, measuring up to 1.7 cm
in diameter. No focally suspicious lesion.

Pancreas not evaluated by this examination.
IMPRESSION: 1. Extrahepatic biliary dilatation without demonstrated common duct
stone. Given the apparent findings of acute pancreatitis on CT,
these findings may be secondary to a recently passed stone or edema
at the ampulla. If there is persistent concern of bilirubin
obstruction, follow-up MRCP may be helpful.
2. No evidence of gallstones or cholecystitis.
3. Heterogeneous hepatic steatosis with simple hepatic cysts.

## 2016-11-13 IMAGING — CR DG CHEST 1V PORT
1 series · 1 of 1 positions shown · non-contrast
Comparison: 04/13/2015

CLINICAL DATA: Generalized abdominal pain and distention. Emesis.
Dyspnea.

EXAM:
PORTABLE CHEST 1 VIEW

[AP]
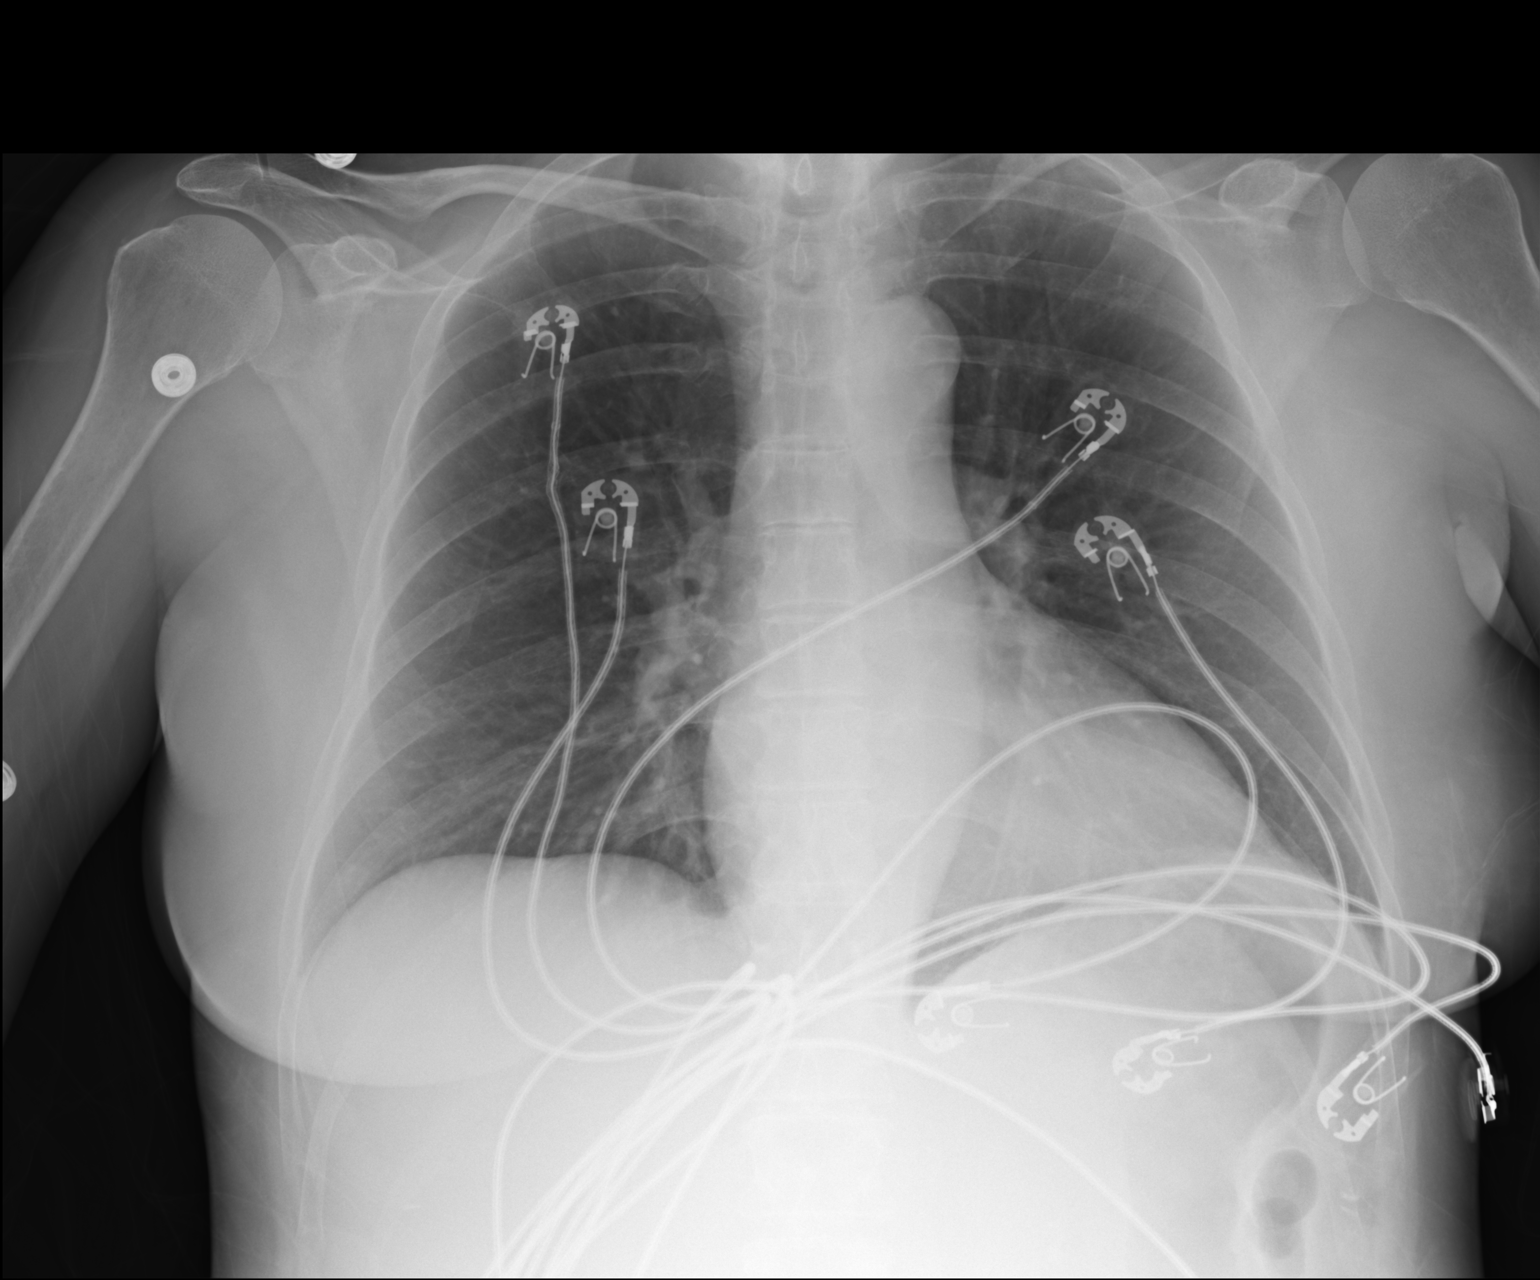

[1 of 1 positions shown; findings below may reference images not displayed]

FINDINGS: The heart size and mediastinal contours are within normal limits.
Both lungs are clear. The visualized skeletal structures are
unremarkable.
IMPRESSION: No active disease.

## 2016-11-13 IMAGING — CT CT ABD-PELV W/ CM
2 of 5 series · 15 of 46 positions shown, 17 images · IV contrast (APPLIED)
Comparison: None.

CLINICAL DATA: Generalized abdominal pain. Nausea and vomiting.
Upper abdominal tenderness.

EXAM:
CT ABDOMEN AND PELVIS WITH CONTRAST
TECHNIQUE: Multidetector CT imaging of the abdomen and pelvis was performed
using the standard protocol following bolus administration of
intravenous contrast.
CONTRAST:  100mL OMNIPAQUE IOHEXOL 300 MG/ML  SOLN

[Series 2: abd/ pelvis 5.0 i30f 1 · axial · 0.66mm/px · z∈[-461,-61]mm · 12 of 92 slices shown, 14 images]
[im 6/92  soft-tissue]
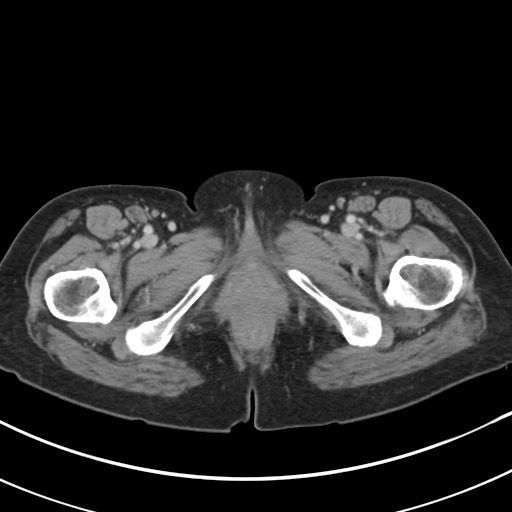
[im 6/92  bone]
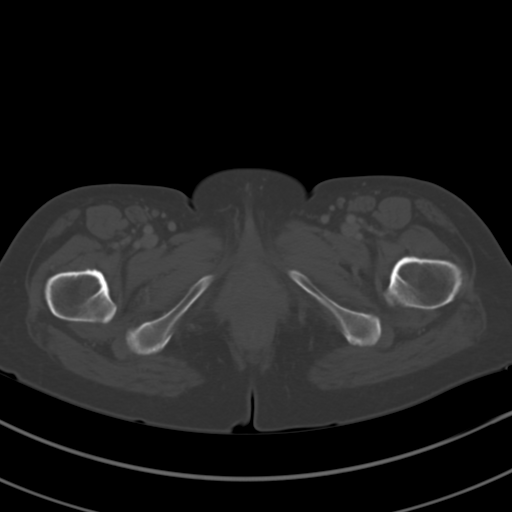
[im 16/92  soft-tissue]
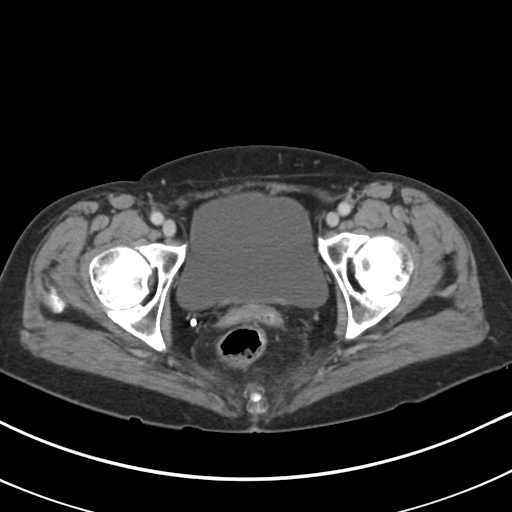
[im 21/92  soft-tissue]
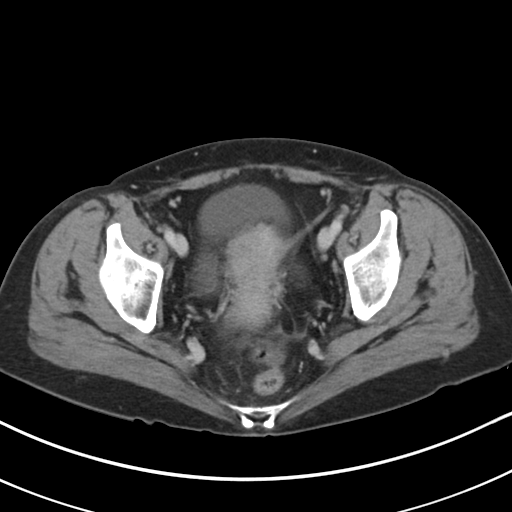
[im 26/92  soft-tissue]
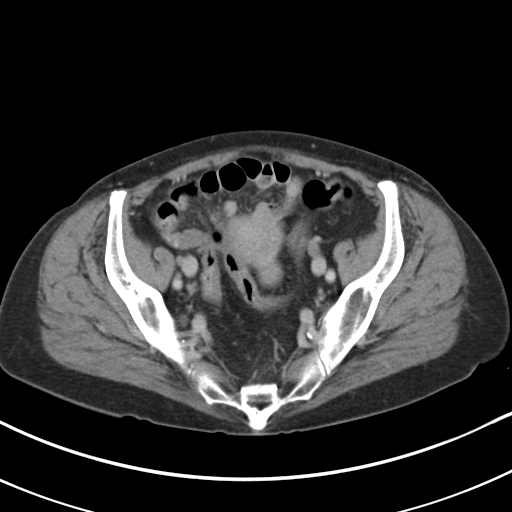
[im 36/92  soft-tissue]
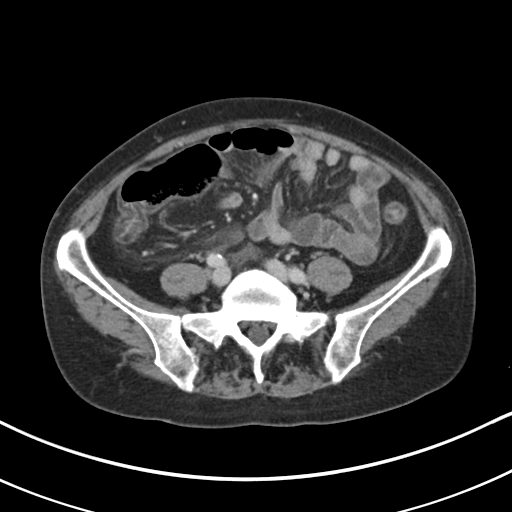
[im 41/92  soft-tissue]
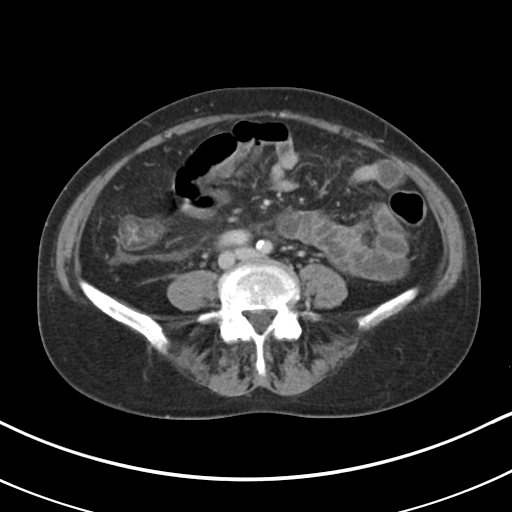
[im 51/92  soft-tissue]
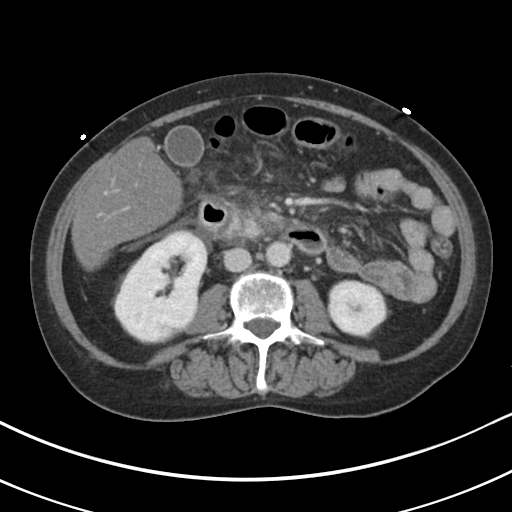
[im 56/92  soft-tissue]
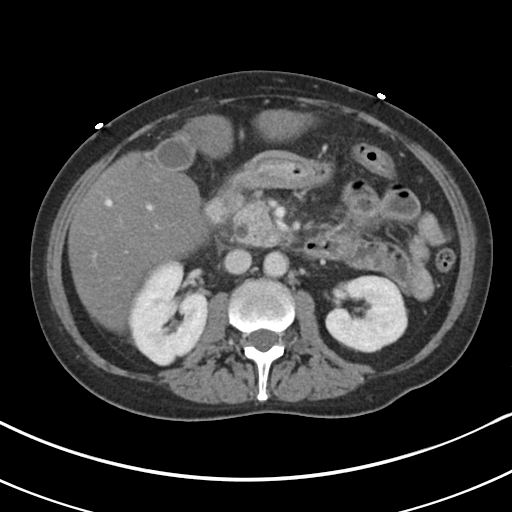
[im 66/92  soft-tissue]
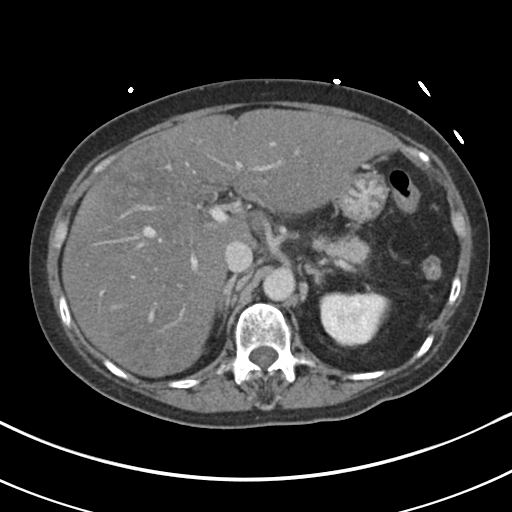
[im 66/92  bone]
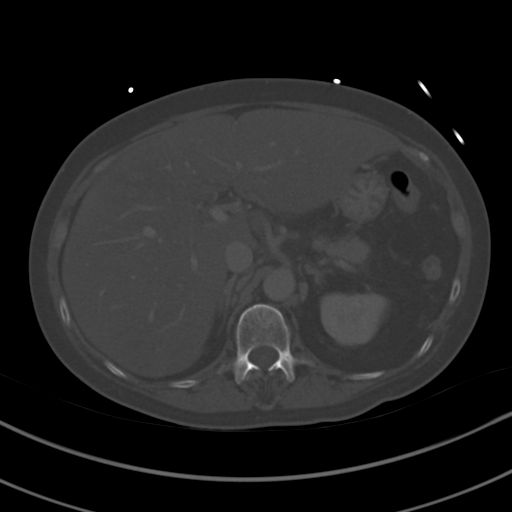
[im 71/92  soft-tissue]
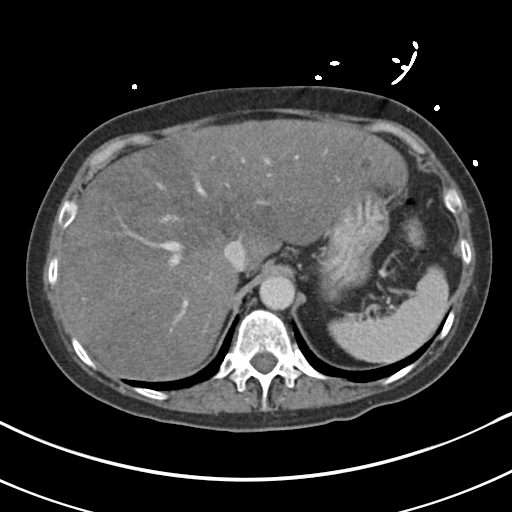
[im 76/92  soft-tissue]
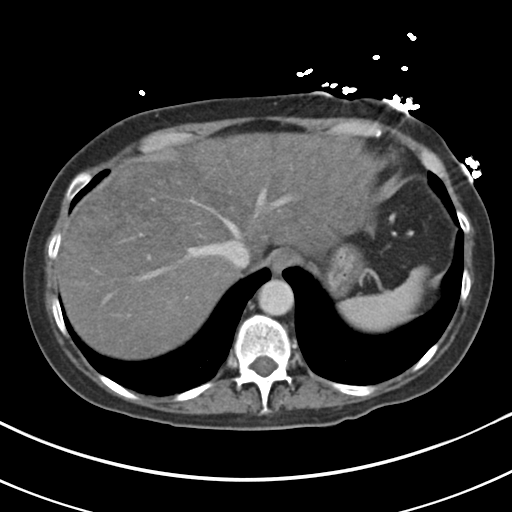
[im 86/92  soft-tissue]
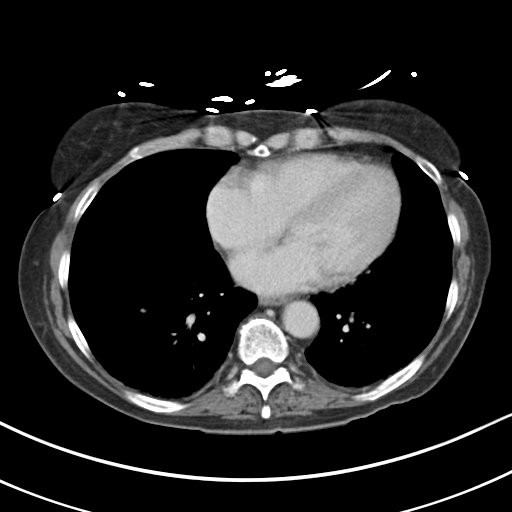

[Series 5: coronal soft tissue · coronal · 0.73mm/px · 3 of 74 slices shown]
[im 25/74  soft-tissue]
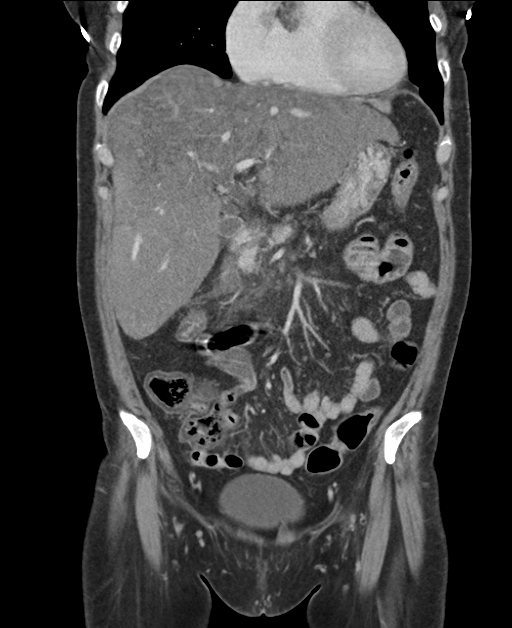
[im 33/74  soft-tissue]
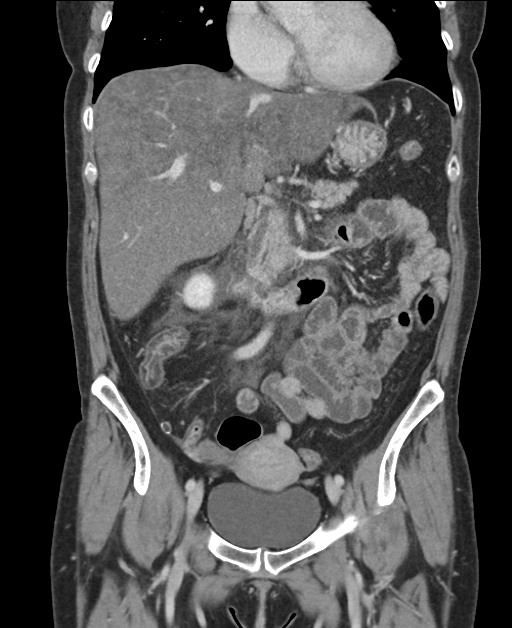
[im 41/74  soft-tissue]
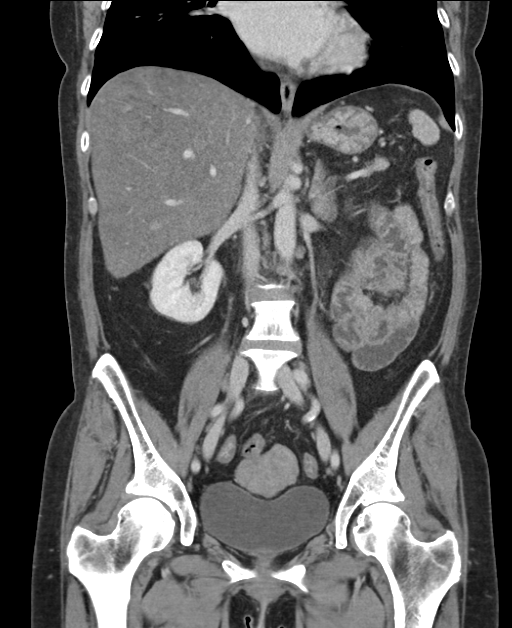

[15 of 46 positions shown; findings below may reference images not displayed]

FINDINGS: Lower chest:  Unremarkable

Hepatobiliary: Extensive and heterogeneous hepatic steatosis.
Hypodense 8 mm focal lesion in the lateral segment left hepatic
lobe, image 21 series 2. A second hypodense lesion in the lateral
segment left hepatic lobe on image 31 series 2 measures 1.7 by
cm. There are some other focal hypodensities which are most likely a
manifestation of focally more severe hepatic steatosis in the liver.
The steatosis is somewhat heterogeneous and speckled in appearance.

Mildly accentuated mucosal thickening of the gallbladder. Abnormally
dilated extrahepatic biliary system with common bile duct at 10 mm.
Possible filling defect in the distal common bile duct on image 33
of series 5, cannot exclude CBD stone. Cholangitis is also not
excluded.

Pancreas: Peripancreatic stranding in the vicinity of the pancreatic
head is specially. The dorsal pancreatic duct does not appear
dilated.

Spleen: Unremarkable

Adrenals/Urinary Tract: Unremarkable

Stomach/Bowel: Abnormal inflammatory stranding adjacent to the
descending duodenum and proximal third portion of the duodenum with
some associated mucosal enhancement. Nondistended segment of
descending colon also demonstrates mucosal enhancement and
potentially mild wall thickening. Appendix absent.

Vascular/Lymphatic: Small retroperitoneal and porta hepatis lymph
nodes are likely reactive. Minimal aortoiliac atherosclerosis.

Reproductive: New multiple uterine fibroids, largest approximately 3
cm along the left posterior uterine body. No endometrial thickening.

Other: Mesenteric edema especially around the pancreatic head, with
a small amount of fluid tracking along the right paracolic gutter.
Small rim calcified nodule along the inferior portion of the right
paracolic gutter on image 63 series 2, likely a residua from remote
inflammation.

Musculoskeletal: Unremarkable
IMPRESSION: 1. Abnormal extrahepatic biliary dilatation possibly with a distal
CBD filling defect, concerning for choledocholithiasis and
obstruction. There is some accentuated mucosal enhancement in the
gallbladder. Inflammatory stranding around the pancreatic head and
descending duodenum may be from secondary inflammation; there is no
dilatation of the dorsal pancreatic duct. Pancreatitis mainly
involving the pancreatic head would be a differential diagnostic
consideration. The possibility of a small mass in the vicinity of
the ampulla is not totally excluded. Consider right upper quadrant
sonography to assess for gallstones.
2. Heterogeneous hypodensity throughout the liver but especially
along the gallbladder fossa and in segment 8. There is thought to be
underlying diffuse hepatic steatosis and this may well represent
heterogeneity of steatosis. No portal or hepatic vein thrombosis is
identified to suggest a vascular occlusive cause for this
heterogeneity. Cholangitis is a possibility but the extrahepatic
bile duct wall does not appear to significantly abnormally enhance.
Hepatitis is not entirely excluded.
3. Several hypodense lesions in the liver are likely cysts.
4. Multiple uterine fibroids.
5. Mesenteric edema, especially around the pancreatic head region.
